# Patient Record
Sex: Female | Born: 1975
Health system: Southern US, Community
[De-identification: ages and names within clinical notes are randomized; demographics above are authoritative.]

## PROBLEM LIST (undated history)

## (undated) DIAGNOSIS — F319 Bipolar disorder, unspecified: Secondary | ICD-10-CM

## (undated) DIAGNOSIS — Z789 Other specified health status: Secondary | ICD-10-CM

## (undated) DIAGNOSIS — F410 Panic disorder [episodic paroxysmal anxiety] without agoraphobia: Secondary | ICD-10-CM

## (undated) DIAGNOSIS — G43909 Migraine, unspecified, not intractable, without status migrainosus: Secondary | ICD-10-CM

## (undated) HISTORY — PX: HERNIA REPAIR: SHX51

## (undated) HISTORY — DX: Panic disorder (episodic paroxysmal anxiety): F41.0

## (undated) HISTORY — PX: OTHER SURGICAL HISTORY: SHX169

## (undated) HISTORY — DX: Migraine, unspecified, not intractable, without status migrainosus: G43.909

## (undated) HISTORY — DX: Bipolar disorder, unspecified: F31.9

---

## 2004-10-25 ENCOUNTER — Ambulatory Visit: Payer: Self-pay | Admitting: Psychiatry

## 2004-10-25 ENCOUNTER — Inpatient Hospital Stay (HOSPITAL_COMMUNITY): Admission: RE | Admit: 2004-10-25 | Discharge: 2004-10-28 | Payer: Self-pay | Admitting: Psychiatry

## 2018-01-29 ENCOUNTER — Inpatient Hospital Stay (HOSPITAL_COMMUNITY)
Admission: RE | Admit: 2018-01-29 | Discharge: 2018-02-01 | DRG: 885 | Disposition: A | Discharge status: Home / Self Care | Payer: Federal, State, Local not specified - Other | Attending: Psychiatry | Admitting: Psychiatry

## 2018-01-29 DIAGNOSIS — F332 Major depressive disorder, recurrent severe without psychotic features: Secondary | ICD-10-CM | POA: Diagnosis not present

## 2018-01-29 DIAGNOSIS — R45851 Suicidal ideations: Secondary | ICD-10-CM | POA: Diagnosis present

## 2018-01-29 DIAGNOSIS — F419 Anxiety disorder, unspecified: Secondary | ICD-10-CM | POA: Diagnosis present

## 2018-01-29 DIAGNOSIS — Z79899 Other long term (current) drug therapy: Secondary | ICD-10-CM | POA: Diagnosis not present

## 2018-01-29 DIAGNOSIS — R51 Headache: Secondary | ICD-10-CM | POA: Diagnosis present

## 2018-01-29 DIAGNOSIS — G4700 Insomnia, unspecified: Secondary | ICD-10-CM | POA: Diagnosis present

## 2018-01-29 HISTORY — DX: Other specified health status: Z78.9

## 2018-01-29 NOTE — H&P (Signed)
Behavioral Health Medical Screening Exam  Alicia Carlson is an 42 y.o. female.presenting to [facility] with her husband. She is endorsing moderate to severe MDD recurrent with SI/plan. She has a previous hx of IP admission 2006. She is compliant with her medications. She is denying nay AVH. She has a hx of migraines.  Total Time spent with patient: 20 minutes  Psychiatric Specialty Exam: Physical Exam  Constitutional: She is oriented to person, place, and time. She appears well-developed and well-nourished. No distress.  HENT:  Head: Normocephalic.  Eyes: Pupils are equal, round, and reactive to light.  Respiratory: Effort normal and breath sounds normal. No respiratory distress.  Neurological: She is alert and oriented to person, place, and time. No cranial nerve deficit.  Skin: Skin is warm and dry. She is not diaphoretic.  Psychiatric: Her speech is normal. Judgment normal. Her mood appears anxious. She is withdrawn. Cognition and memory are normal. She exhibits a depressed mood. She expresses suicidal ideation. She expresses suicidal plans.    Review of Systems  Constitutional: Negative for chills, diaphoresis, fever, malaise/fatigue and weight loss.  Neurological: Positive for headaches.  Psychiatric/Behavioral: Positive for depression and suicidal ideas. The patient is nervous/anxious.     There were no vitals taken for this visit.There is no height or weight on file to calculate BMI.  General Appearance: Casual  Eye Contact:  Good  Speech:  Clear and Coherent  Volume:  Normal  Mood:  Depressed  Affect:  Congruent  Thought Process:  Goal Directed  Orientation:  Full (Time, Place, and Person)  Thought Content:  Logical  Suicidal Thoughts:  Yes.  with intent/plan  Homicidal Thoughts:  No  Memory:  Immediate;   Good  Judgement:  Fair  Insight:  Fair  Psychomotor Activity:  Normal  Concentration: Concentration: Good  Recall:  Fair  Fund of Knowledge:Fair  Language: Good   Akathisia:  Negative  Handed:  Right  AIMS (if indicated):     Assets:  Desire for Improvement  Sleep:       Musculoskeletal: Strength & Muscle Tone: within normal limits Gait & Station: normal Patient leans: N/A  There were no vitals taken for this visit.  Recommendations:  Based on my evaluation the patient does not appear to have an emergency medical condition.  Kerry Hough, PA-C 01/29/2018, 11:40 PM

## 2018-01-30 ENCOUNTER — Encounter (HOSPITAL_COMMUNITY): Payer: Self-pay

## 2018-01-30 ENCOUNTER — Other Ambulatory Visit: Payer: Self-pay

## 2018-01-30 DIAGNOSIS — F332 Major depressive disorder, recurrent severe without psychotic features: Principal | ICD-10-CM

## 2018-01-30 DIAGNOSIS — R45851 Suicidal ideations: Secondary | ICD-10-CM

## 2018-01-30 LAB — URINALYSIS, ROUTINE W REFLEX MICROSCOPIC
Bilirubin Urine: NEGATIVE
Glucose, UA: NEGATIVE mg/dL
Hgb urine dipstick: NEGATIVE
Ketones, ur: NEGATIVE mg/dL
Nitrite: NEGATIVE
Protein, ur: NEGATIVE mg/dL
Specific Gravity, Urine: 1.01 (ref 1.005–1.030)
pH: 6 (ref 5.0–8.0)

## 2018-01-30 LAB — COMPREHENSIVE METABOLIC PANEL
ALT: 29 U/L (ref 0–44)
AST: 23 U/L (ref 15–41)
Albumin: 4.1 g/dL (ref 3.5–5.0)
Alkaline Phosphatase: 64 U/L (ref 38–126)
Anion gap: 9 (ref 5–15)
BUN: 16 mg/dL (ref 6–20)
CO2: 24 mmol/L (ref 22–32)
Calcium: 9.1 mg/dL (ref 8.9–10.3)
Chloride: 104 mmol/L (ref 98–111)
Creatinine, Ser: 1.37 mg/dL — ABNORMAL HIGH (ref 0.44–1.00)
GFR calc Af Amer: 53 mL/min — ABNORMAL LOW (ref >60)
GFR calc non Af Amer: 46 mL/min — ABNORMAL LOW (ref >60)
Glucose, Bld: 104 mg/dL — ABNORMAL HIGH (ref 70–99)
Potassium: 3.5 mmol/L (ref 3.5–5.1)
Sodium: 137 mmol/L (ref 135–145)
Total Bilirubin: 0.5 mg/dL (ref 0.3–1.2)
Total Protein: 7.1 g/dL (ref 6.5–8.1)

## 2018-01-30 LAB — CBC
HCT: 40.9 % (ref 36.0–46.0)
Hemoglobin: 13.3 g/dL (ref 12.0–15.0)
MCH: 29.8 pg (ref 26.0–34.0)
MCHC: 32.5 g/dL (ref 30.0–36.0)
MCV: 91.5 fL (ref 80.0–100.0)
Platelets: 256 10*3/uL (ref 150–400)
RBC: 4.47 MIL/uL (ref 3.87–5.11)
RDW: 13.2 % (ref 11.5–15.5)
WBC: 10.6 10*3/uL — ABNORMAL HIGH (ref 4.0–10.5)
nRBC: 0 % (ref 0.0–0.2)

## 2018-01-30 LAB — RAPID URINE DRUG SCREEN, HOSP PERFORMED
Amphetamines: NOT DETECTED
Barbiturates: NOT DETECTED
Benzodiazepines: NOT DETECTED
Cocaine: NOT DETECTED
Opiates: NOT DETECTED
Tetrahydrocannabinol: NOT DETECTED

## 2018-01-30 LAB — LIPID PANEL
Cholesterol: 291 mg/dL — ABNORMAL HIGH (ref 0–200)
HDL: 52 mg/dL (ref >40)
LDL Cholesterol: 201 mg/dL — ABNORMAL HIGH (ref 0–99)
Total CHOL/HDL Ratio: 5.6 RATIO
Triglycerides: 191 mg/dL — ABNORMAL HIGH (ref <150)
VLDL: 38 mg/dL (ref 0–40)

## 2018-01-30 LAB — TSH: TSH: 4.468 u[IU]/mL (ref 0.350–4.500)

## 2018-01-30 LAB — PREGNANCY, URINE: Preg Test, Ur: NEGATIVE

## 2018-01-30 MED ORDER — LAMOTRIGINE 100 MG PO TABS
100.0000 mg | ORAL_TABLET | Freq: Two times a day (BID) | ORAL | Status: DC
  Administered 2018-01-30 – 2018-02-01 (×5): 100 mg via ORAL
  Filled 2018-01-30 (×9): qty 1

## 2018-01-30 MED ORDER — TRAZODONE HCL 100 MG PO TABS
ORAL_TABLET | ORAL | Status: AC
  Filled 2018-01-30: qty 1

## 2018-01-30 MED ORDER — DULOXETINE HCL 60 MG PO CPEP
60.0000 mg | ORAL_CAPSULE | Freq: Two times a day (BID) | ORAL | Status: DC
  Filled 2018-01-30 (×3): qty 1

## 2018-01-30 MED ORDER — ACETAMINOPHEN 325 MG PO TABS: 650.0000 mg | ORAL_TABLET | Freq: Four times a day (QID) | ORAL | Status: DC | PRN

## 2018-01-30 MED ORDER — TOPIRAMATE 25 MG PO TABS
50.0000 mg | ORAL_TABLET | Freq: Two times a day (BID) | ORAL | Status: DC
  Administered 2018-01-30 – 2018-02-01 (×4): 50 mg via ORAL
  Filled 2018-01-30 (×8): qty 2

## 2018-01-30 MED ORDER — ALUM & MAG HYDROXIDE-SIMETH 200-200-20 MG/5ML PO SUSP: 30.0000 mL | ORAL | Status: DC | PRN

## 2018-01-30 MED ORDER — HYDROXYZINE HCL 25 MG PO TABS
25.0000 mg | ORAL_TABLET | Freq: Four times a day (QID) | ORAL | Status: DC | PRN
  Administered 2018-01-30 (×2): 25 mg via ORAL
  Filled 2018-01-30: qty 1

## 2018-01-30 MED ORDER — LAMOTRIGINE 200 MG PO TABS
200.0000 mg | ORAL_TABLET | Freq: Every day | ORAL | Status: DC
  Filled 2018-01-30 (×2): qty 1

## 2018-01-30 MED ORDER — DULOXETINE HCL 30 MG PO CPEP
90.0000 mg | ORAL_CAPSULE | Freq: Every day | ORAL | Status: DC
  Administered 2018-01-30 – 2018-02-01 (×3): 90 mg via ORAL
  Filled 2018-01-30 (×6): qty 3

## 2018-01-30 MED ORDER — TOPIRAMATE 25 MG PO TABS
50.0000 mg | ORAL_TABLET | Freq: Every day | ORAL | Status: DC
  Administered 2018-01-30: 50 mg via ORAL
  Filled 2018-01-30 (×3): qty 2

## 2018-01-30 MED ORDER — MIRTAZAPINE 15 MG PO TABS
15.0000 mg | ORAL_TABLET | Freq: Every day | ORAL | Status: DC
  Administered 2018-01-30 – 2018-01-31 (×2): 15 mg via ORAL
  Filled 2018-01-30 (×4): qty 1

## 2018-01-30 MED ORDER — TRAZODONE HCL 100 MG PO TABS: 100.0000 mg | ORAL_TABLET | Freq: Every evening | ORAL | Status: DC | PRN

## 2018-01-30 MED ORDER — ADULT MULTIVITAMIN W/MINERALS CH
1.0000 | ORAL_TABLET | Freq: Every day | ORAL | Status: DC
  Administered 2018-01-30 – 2018-02-01 (×3): 1 via ORAL
  Filled 2018-01-30 (×5): qty 1

## 2018-01-30 MED ORDER — MAGNESIUM HYDROXIDE 400 MG/5ML PO SUSP: 30.0000 mL | Freq: Every day | ORAL | Status: DC | PRN

## 2018-01-30 MED ORDER — TRAZODONE HCL 50 MG PO TABS
50.0000 mg | ORAL_TABLET | Freq: Every evening | ORAL | Status: DC | PRN
  Administered 2018-01-30 – 2018-01-31 (×2): 50 mg via ORAL
  Filled 2018-01-30 (×8): qty 1

## 2018-01-30 MED ORDER — TRAZODONE HCL 100 MG PO TABS
100.0000 mg | ORAL_TABLET | Freq: Every evening | ORAL | Status: DC | PRN
  Administered 2018-01-30: 100 mg via ORAL
  Filled 2018-01-30 (×3): qty 1

## 2018-01-30 MED ORDER — LAMOTRIGINE 200 MG PO TABS: 200.0000 mg | ORAL_TABLET | Freq: Every day | ORAL | Status: DC

## 2018-01-30 NOTE — BHH Counselor (Signed)
Adult Comprehensive Assessment  Patient ID: Alicia Carlson, female   DOB: 09-26-1975, 42 y.o.   MRN: [id number]  Information Source: Information source: Patient  Current Stressors:  Patient states their primary concerns and needs for treatment are:: "I could not stop thinking about killing myself" Patient states their goals for this hospitilization and ongoing recovery are:: "Get rid of the suicidal thoughts" Educational / Learning stressors: Patient denies any stressors  Employment / Job issues: On disability; Unemployed --Recently quit her  part-time job  Family Relationships: Patient reports having a severly strained relationship with her daughter; Patient reports this stressor has triggered her depressive symptoms, including suicidal thoughts. Patient also reports she and her husband have some marital issues due to their mental health issues.  Financial / Lack of resources (include bankruptcy): SSDI; Patient denies any current stressors  Housing / Lack of housing: Live with husband in [place], [place] Physical health (include injuries & life threatening diseases): Patient reports experiencing migraines occassionally.  Social relationships: Patient reports having a distant relationship with her friends currently.  Substance abuse: Patient denies any substance abuse issues  Bereavement / Loss: Patient denies any stressors   Living/Environment/Situation:  Living Arrangements: Spouse/significant other Living conditions (as described by patient or guardian): "Good" Who else lives in the home?: Husband  How long has patient lived in current situation?: 12 years  What is atmosphere in current home: Comfortable, Loving  Family History:  Marital status: Married Number of Years Married: 17 What types of issues is patient dealing with in the relationship?: Patient reports her husband struggles with depression and that his mother is priority over her.  Additional relationship information: No Are you  sexually active?: No What is your sexual orientation?: Heterosexual  Has your sexual activity been affected by drugs, alcohol, medication, or emotional stress?: No  Does patient have children?: Yes How many children?: 1 How is patient's relationship with their children?: Patient reports that she currently has a strained relationship with her only adult daughter.   Childhood History:  By whom was/is the patient raised?: Both parents Description of patient's relationship with caregiver when they were a child: Patient reports having an "okay" relationship with her parents as a child.  Patient's description of current relationship with people who raised him/her: Patient states that her mother is currently deceased, however she continues to have an"okay" relationship with her father.  How were you disciplined when you got in trouble as a child/adolescent?: Whoopings  Does patient have siblings?: Yes Number of Siblings: 1 Description of patient's current relationship with siblings: Patient reports having an "okay" relationship with her younger brother currently. Patient reports they have been closer in the past. Patient reports not having a relationship with her older sister.  Did patient suffer any verbal/emotional/physical/sexual abuse as a child?: No Did patient suffer from severe childhood neglect?: No Has patient ever been sexually abused/assaulted/raped as an adolescent or adult?: No Was the patient ever a victim of a crime or a disaster?: No Witnessed domestic violence?: No Has patient been effected by domestic violence as an adult?: No  Education:  Highest grade of school patient has completed: 10th grade  Currently a student?: No Learning disability?: No  Employment/Work Situation:   Employment situation: Unemployed Patient's job has been impacted by current illness: Yes Describe how patient's job has been impacted: Patient reports she recently quit her job due  What is the longest  time patient has a held a job?: 2 1/2 years  Where was the patient  employed at that time?: [employer]  Did You Receive Any Psychiatric Treatment/Services While in the [employer]?: No Are There Guns or Other Weapons in Your Home?: Yes Types of Guns/Weapons: Guns Are These Comptroller?: Yes  Financial Resources:   Financial resources: Insurance claims handler, Income from spouse Does patient have a Lawyer or guardian?: No  Alcohol/Substance Abuse:   What has been your use of drugs/alcohol within the last 12 months?: Patient denies  If attempted suicide, did drugs/alcohol play a role in this?: No Alcohol/Substance Abuse Treatment Hx: Denies past history Has alcohol/substance abuse ever caused legal problems?: No  Social Support System:   Forensic psychologist System: Poor Type of faith/religion: Spiritual  How does patient's faith help to cope with current illness?: Prayer   Leisure/Recreation:   Leisure and Hobbies: "I love to shop, I like going to concerts I enjoy going to the movies, I like walking and I'm starting up pottery classes"  Strengths/Needs:   What is the patient's perception of their strengths?: "Insightful, and I have a good sense of humor" Patient states they can use these personal strengths during their treatment to contribute to their recovery: Yes  Patient states these barriers may affect/interfere with their treatment: No  Patient states these barriers may affect their return to the community: No  Other important information patient would like considered in planning for their treatment: No   Discharge Plan:   Currently receiving community mental health services: Yes (From Whom)( Counseling and [facility] ) Patient states concerns and preferences for aftercare planning are: Continue with current providers.  Patient states they will know when they are safe and ready for discharge when: Decrease in symptoms.  Does patient have access to  transportation?: Yes Does patient have financial barriers related to discharge medications?: Yes Patient description of barriers related to discharge medications: No insurance  Will patient be returning to same living situation after discharge?: Yes  Summary/Recommendations:   Summary and Recommendations (to be completed by the evaluator): Alicia Carlson is a 42 year old female who is diagnosed with MDD recurrent severe. She presented to the hospital seeking treatment for worsening depressive symtpoms and suicidal ideation. During the assessment, Alicia Carlson was pleasant and cooperative, however seemed to be slightly tangential. Alicia Carlson reports that she came to the hospital because she "could not stop thinking about killing herself". Alicia Carlson reports having a strained relationship with her daughter and husband, which she beleives has triggered this most recent episode. Alicia Carlson states that she currently sees Alicia Carlson teacher at [employer] and goes to [employer] for medication management. Alicia Carlson can benefit from crisis stabilization, medication management, therapeutic milieu and referral services.   Maeola Sarah. 01/30/2018
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
## 2018-01-30 NOTE — Progress Notes (Signed)
Admission Note:  Alicia Carlson is a 42 y.o. female admitted as a walk-in with SI with plan to slit wrist. Pt was last here in 2006. Pt denies HI/AVH/Pain. Pt denies drug/alcohol/tobacco-use. Pt states primary stressor is family dynamics. Pt states "My daughter blames me but I pay for everything". Pt also endorses difficulty getting out of bed and not taking care of self with hygiene. Pt states she put in a notice 10/21 at work. 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 Pt states she lives with her husband. Pt states she is on disability for mental illness. Pt state she sees TEFL teacher at [employer] and attends [employer]. Pt states she takes Lamictal, Cymbalta, Topamax, and Vistaril. Pt states she is compliant with her medications. Skin was assessed and found to be clear of any abnormal marks. Pt searched and no contraband found, POC and unit policies explained and understanding verbalized. Consents obtained. Food and fluids offered, and fluids accepted. Pt had no additional questions or concerns. Belongings in locker #10.

## 2018-01-30 NOTE — BHH Group Notes (Signed)
LCSW Group Therapy Note 01/30/2018 12:12 PM  Type of Therapy/Topic: Group Therapy: Feelings about Diagnosis  Participation Level: Active   Description of Group:  This group will allow patients to explore their thoughts and feelings about diagnoses they have received. Patients will be guided to explore their level of understanding and acceptance of these diagnoses. Facilitator will encourage patients to process their thoughts and feelings about the reactions of others to their diagnosis and will guide patients in identifying ways to discuss their diagnosis with significant others in their lives. This group will be process-oriented, with patients participating in exploration of their own experiences, giving and receiving support, and processing challenge from other group members.  Therapeutic Goals: 1. Patient will demonstrate understanding of diagnosis as evidenced by identifying two or more symptoms of the disorder 2. Patient will be able to express two feelings regarding the diagnosis 3. Patient will demonstrate their ability to communicate their needs through discussion and/or role play  Summary of Patient Progress:  Alicia Carlson was engaged and participated throughout the group session. Alicia Carlson states that she accepted her mental health diagnosis initially because she knew something was wrong for " a while".      Therapeutic Modalities:  Cognitive Behavioral Therapy Brief Therapy Feelings Identification    Earvin Blazier Catalina Antigua Clinical Social Worker

## 2018-01-30 NOTE — Progress Notes (Signed)
Urine cup provided. Pending sample.

## 2018-01-30 NOTE — BHH Counselor (Signed)
CSW attempted to see the patient to complete the assessment, however the patient reports she does not feel well and did not want to participate in the assessment process. CSW will attempt to see the patient at a later time.   Baldo Daub, MSW, LCSWA Clinical Social Worker [facility]  Phone: [phone]

## 2018-01-30 NOTE — BHH Suicide Risk Assessment (Signed)
[facility] Admission Suicide Risk Assessment   Nursing information obtained from:    Demographic factors:  Caucasian Current Mental Status:  Suicidal ideation indicated by patient, Suicide plan Loss Factors:  Financial problems / change in socioeconomic status Historical Factors:  Prior suicide attempts Risk Reduction Factors:  Living with another person, especially a relative, Positive social support  Total Time spent with patient: 20 minutes Principal Problem: <principal problem not specified> Diagnosis:   Patient Active Problem List   Diagnosis Date Noted  . MDD (major depressive disorder), recurrent episode, severe (HCC) [F33.2] 01/30/2018   Subjective Data: Patient is seen and examined.  Patient is a 42 year old female with a past psychiatric history reported to be bipolar disorder type II who presented as a walk-in to the behavioral health hospital on 01/30/2018.  Patient stated that she been having worsening depressive illness over the last several weeks.  She stated that she had been thinking about killing herself.  She told her husband on the date of admission that she had a plan to "slit my wrists".  The major stressor in her life is her daughter.  The patient stated that her daughter moved out a year ago, and had significant mental health problems.  The patient's been sending the daughter money for the last year.  She stopped giving money to the daughter proximally a month ago, and the daughter is been more enraged towards her.  She actually stated that she thought her mother (the patient) was "stalking her".  Patient stated that she is been depressed since the day after she gave birth to her daughter.  She is followed at the local mental health center in [place], and sees a therapist.  She was unsure of whether or not they worked on [employer], [employer].  She has been on multiple medications in the past.  She thought the Cymbalta that she is currently taking was the most effective.  She was  admitted to the hospital for evaluation and stabilization.  Continued Clinical Symptoms:  Alcohol Use Disorder Identification Test Final Score (AUDIT): 0 The "Alcohol Use Disorders Identification Test", Guidelines for Use in Primary Care, Second Edition.  World Science writer [facility]). Score between 0-7:  no or low risk or alcohol related problems. Score between 8-15:  moderate risk of alcohol related problems. Score between 16-19:  high risk of alcohol related problems. Score 20 or above:  warrants further diagnostic evaluation for alcohol dependence and treatment.   CLINICAL FACTORS:   Bipolar Disorder:   Bipolar II Depression:   Anhedonia Hopelessness Impulsivity Insomnia Personality Disorders:   Cluster B   Musculoskeletal: Strength & Muscle Tone: within normal limits Gait & Station: normal Patient leans: N/A  Psychiatric Specialty Exam: Physical Exam  Nursing note and vitals reviewed. Constitutional: She is oriented to person, place, and time. She appears well-developed and well-nourished.  HENT:  Head: Normocephalic and atraumatic.  Respiratory: Effort normal.  Neurological: She is alert and oriented to person, place, and time.    ROS  Blood pressure 93/74, pulse 93, temperature 98.3 F (36.8 C), temperature source Oral, resp. rate 20, height 5\' 2"  (1.575 m), weight 74.4 kg.Body mass index is 30 kg/m.  General Appearance: Casual  Eye Contact:  Minimal  Speech:  Normal Rate  Volume:  Normal  Mood:  Anxious  Affect:  Congruent  Thought Process:  Coherent and Descriptions of Associations: Intact  Orientation:  Full (Time, Place, and Person)  Thought Content:  Logical  Suicidal Thoughts:  Yes.  without  intent/plan  Homicidal Thoughts:  No  Memory:  Immediate;   Fair Recent;   Fair Remote;   Fair  Judgement:  Impaired  Insight:  Lacking  Psychomotor Activity:  Normal  Concentration:  Concentration: Fair and Attention Span: Fair  Recall:  [employer] of  Knowledge:  Fair  Language:  Fair  Akathisia:  Negative  Handed:  Right  AIMS (if indicated):     Assets:  Communication Skills Desire for Improvement Financial Resources/Insurance Housing Physical Health Resilience Social Support  ADL's:  Intact  Cognition:  WNL  Sleep:  Number of Hours: 4      COGNITIVE FEATURES THAT CONTRIBUTE TO RISK:  None    SUICIDE RISK:   Minimal: No identifiable suicidal ideation.  Patients presenting with no risk factors but with morbid ruminations; may be classified as minimal risk based on the severity of the depressive symptoms  PLAN OF CARE: Patient is seen and examined.  Patient is a 42 year old female with the above-stated past psychiatric history was admitted secondary to suicidal ideation worsening depression.  She will be integrated into the milieu.  She will be encouraged to attend groups.  She will be seen by social work.  She is encouraged to work on her coping skills.  Her Cymbalta being increased to 90 mg p.o. daily.  We will find out what the dosage of her mirtazapine is.  Her Lamictal be continued at 100 mg p.o. twice daily.  We will get collateral information from her husband.  I certify that inpatient services furnished can reasonably be expected to improve the patient's condition.   Antonieta Pert, MD 01/30/2018, 8:03 AM

## 2018-01-30 NOTE — Tx Team (Signed)
Initial Treatment Plan 01/30/2018 1:01 AM Roselee Nova ZOX:[id number]    PATIENT STRESSORS: Financial difficulties Marital or family conflict   PATIENT STRENGTHS: Average or above average intelligence Communication skills General fund of knowledge Physical Health Supportive family/friends Work skills   PATIENT IDENTIFIED PROBLEMS: "depression"   "At risk for suicide"   "Family problems"                  DISCHARGE CRITERIA:  Ability to meet basic life and health needs Improved stabilization in mood, thinking, and/or behavior Verbal commitment to aftercare and medication compliance  PRELIMINARY DISCHARGE PLAN: Attend PHP/IOP Outpatient therapy Participate in family therapy Return to previous living arrangement  PATIENT/FAMILY INVOLVEMENT: This treatment plan has been presented to and reviewed with the patient, [employer].The patient have been given the opportunity to ask questions and make suggestions.  Tyrone Apple, RN 01/30/2018, 1:01 AM

## 2018-01-30 NOTE — H&P (Signed)
Psychiatric Admission Assessment Adult  Patient Identification: Alicia Carlson MRN:  [id number] Date of Evaluation:  01/30/2018 Chief Complaint:  MDD Principal Diagnosis: <principal problem not specified> Diagnosis:   Patient Active Problem List   Diagnosis Date Noted  . MDD (major depressive disorder), recurrent episode, severe ([place]) [F33.2] 01/30/2018   History of Present Illness: Patient is seen and examined.  Patient is a 42 year old female with a past psychiatric history reported to be bipolar disorder type II who presented as a walk-in to the behavioral health hospital on 01/30/2018.  Patient stated that she been having worsening depressive illness over the last several weeks.  She stated that she had been thinking about killing herself.  She told her husband on the date of admission that she had a plan to "slit my wrists".  The major stressor in her life is her daughter.  The patient stated that her daughter moved out a year ago, and had significant mental health problems.  The patient's been sending the daughter money for the last year.  She stopped giving money to the daughter proximally a month ago, and the daughter is been more enraged towards her.  She actually stated that she thought her mother (the patient) was "stalking her".  Patient stated that she is been depressed since the day after she gave birth to her daughter.  She is followed at the local mental health center in [place], and sees a therapist.  She was unsure of whether or not they worked on Audiological scientist.  She has been on multiple medications in the past.  She thought the Cymbalta that she is currently taking was the most effective.  She was admitted to the hospital for evaluation and stabilization.  Associated Signs/Symptoms: Depression Symptoms:  depressed mood, anhedonia, insomnia, psychomotor agitation, fatigue, feelings of worthlessness/guilt, difficulty concentrating, hopelessness, suicidal thoughts  without plan, anxiety, loss of energy/fatigue, disturbed sleep, (Hypo) Manic Symptoms:  Impulsivity, Irritable Mood, Anxiety Symptoms:  Excessive Worry, Psychotic Symptoms:  Denied PTSD Symptoms: Negative Total Time spent with patient: 30 minutes  Past Psychiatric History: Patient had one previous psychiatric admission here several years ago.  She is been followed at the mental health center in [place] for several years.  By her report "I been treated with every medicine there is".  She feels like the Cymbalta is a medicine that has helped her the most.  Is the patient at risk to self? Yes.    Has the patient been a risk to self in the past 6 months? Yes.    Has the patient been a risk to self within the distant past? Yes.    Is the patient a risk to others? No.  Has the patient been a risk to others in the past 6 months? No.  Has the patient been a risk to others within the distant past? No.   Prior Inpatient Therapy: Prior Inpatient Therapy: Yes Prior Therapy Dates: 2006 Prior Therapy Facilty/Provider(s): [facility] Reason for Treatment: SI Prior Outpatient Therapy: Prior Outpatient Therapy: Yes Prior Therapy Dates: Current Prior Therapy Facilty/Provider(s): [facility] [place] Reason for Treatment: therapist / med management Does patient have an ACCT team?: No Does patient have Intensive In-House Services?  : No Does patient have [facility] services? : No Does patient have P4CC services?: No  Alcohol Screening: 1. How often do you have a drink containing alcohol?: Never 2. How many drinks containing alcohol do you have on a typical day when you are drinking?: 1 or 2  3. How often do you have six or more drinks on one occasion?: Never AUDIT-C Score: 0 4. How often during the last year have you found that you were not able to stop drinking once you had started?: Never 5. How often during the last year have you failed to do what was normally expected from you  becasue of drinking?: Never 6. How often during the last year have you needed a first drink in the morning to get yourself going after a heavy drinking session?: Never 7. How often during the last year have you had a feeling of guilt of remorse after drinking?: Never 8. How often during the last year have you been unable to remember what happened the night before because you had been drinking?: Never 9. Have you or someone else been injured as a result of your drinking?: No 10. Has a relative or friend or a doctor or another health worker been concerned about your drinking or suggested you cut down?: No Alcohol Use Disorder Identification Test Final Score (AUDIT): 0 Substance Abuse History in the last 12 months:  No. Consequences of Substance Abuse: Negative Previous Psychotropic Medications: Yes  Psychological Evaluations: Yes  Past Medical History:  Past Medical History:  Diagnosis Date  . Medical history non-contributory    History reviewed. No pertinent surgical history. Family History: History reviewed. No pertinent family history. Family Psychiatric  History: Daughter has borderline personality disorder per the patient's description.  The patient also stated that the mother side of her family has multiple members that are psychiatrically ill. Tobacco Screening: Have you used any form of tobacco in the last 30 days? (Cigarettes, Smokeless Tobacco, Cigars, and/or Pipes): No Social History:  Social History   Substance and Sexual Activity  Alcohol Use Not Currently     Social History   Substance and Sexual Activity  Drug Use Never    Additional Social History: Marital status: Married    Pain Medications: None Prescriptions: Cymbalta 64m, Prozac 252m Lamictal 10078mwice daily, Vistaril 9m68mopomax 50mg105mr the Counter: None History of alcohol / drug use?: No history of alcohol / drug abuse                    Allergies:  No Known Allergies Lab Results:  Results  for orders placed or performed during the hospital encounter of 01/29/18 (from the past 48 hour(s))  Urinalysis, Routine w reflex microscopic     Status: Abnormal   Collection Time: 01/30/18  6:28 AM  Result Value Ref Range   Color, Urine YELLOW YELLOW   APPearance CLEAR CLEAR   Specific Gravity, Urine 1.010 1.005 - 1.030   pH 6.0 5.0 - 8.0   Glucose, UA NEGATIVE NEGATIVE mg/dL   Hgb urine dipstick NEGATIVE NEGATIVE   Bilirubin Urine NEGATIVE NEGATIVE   Ketones, ur NEGATIVE NEGATIVE mg/dL   Protein, ur NEGATIVE NEGATIVE mg/dL   Nitrite NEGATIVE NEGATIVE   Leukocytes, UA TRACE (A) NEGATIVE   RBC / HPF 0-5 0 - 5 RBC/hpf   WBC, UA 0-5 0 - 5 WBC/hpf   Bacteria, UA RARE (A) NONE SEEN   Squamous Epithelial / LPF 0-5 0 - 5   Mucus PRESENT     Comment: Performed at [facility] [id number], urine     Status: None   Collection Time: 01/30/18  6:28 AM  Result Value Ref Range   Preg Test, Ur NEGATIVE NEGATIVE  Comment:        THE SENSITIVITY OF THIS METHODOLOGY IS >20 mIU/mL. Performed at [facility], [place] [address] [place], [place] [address]   Urine rapid drug screen (hosp performed)not at [facility], [facility]     Status: None   Collection Time: 01/30/18  6:28 AM  Result Value Ref Range   Opiates NONE DETECTED NONE DETECTED   Cocaine NONE DETECTED NONE DETECTED   Benzodiazepines NONE DETECTED NONE DETECTED   Amphetamines NONE DETECTED NONE DETECTED   Tetrahydrocannabinol NONE DETECTED NONE DETECTED   Barbiturates NONE DETECTED NONE DETECTED    Comment: (NOTE) DRUG SCREEN FOR MEDICAL PURPOSES ONLY.  IF CONFIRMATION IS NEEDED FOR ANY PURPOSE, NOTIFY LAB WITHIN 5 DAYS. LOWEST DETECTABLE LIMITS FOR URINE DRUG SCREEN Drug Class                     Cutoff (ng/mL) Amphetamine and metabolites    1000 Barbiturate and metabolites    200 Benzodiazepine                 962 Tricyclics and metabolites     300 Opiates and  metabolites        300 Cocaine and metabolites        300 THC                            50 Performed at [facility], [place] [address], [place], [place] [address]   CBC     Status: Abnormal   Collection Time: 01/30/18  6:32 AM  Result Value Ref Range   WBC 10.6 (H) 4.0 - 10.5 K/uL   RBC 4.47 3.87 - 5.11 MIL/uL   Hemoglobin 13.3 12.0 - 15.0 g/dL   HCT 40.9 36.0 - 46.0 %   MCV 91.5 80.0 - 100.0 fL   MCH 29.8 26.0 - 34.0 pg   MCHC 32.5 30.0 - 36.0 g/dL   RDW 13.2 11.5 - 15.5 %   Platelets 256 150 - 400 K/uL   nRBC 0.0 0.0 - 0.2 %    Comment: Performed at [facility], [place] [address], [place], [place] [address]  Comprehensive metabolic panel     Status: Abnormal   Collection Time: 01/30/18  6:32 AM  Result Value Ref Range   Sodium 137 135 - 145 mmol/L   Potassium 3.5 3.5 - 5.1 mmol/L   Chloride 104 98 - 111 mmol/L   CO2 24 22 - 32 mmol/L   Glucose, Bld 104 (H) 70 - 99 mg/dL   BUN 16 6 - 20 mg/dL   Creatinine, Ser 1.37 (H) 0.44 - 1.00 mg/dL   Calcium 9.1 8.9 - 10.3 mg/dL   Total Protein 7.1 6.5 - 8.1 g/dL   Albumin 4.1 3.5 - 5.0 g/dL   AST 23 15 - 41 U/L   ALT 29 0 - 44 U/L   Alkaline Phosphatase 64 38 - 126 U/L   Total Bilirubin 0.5 0.3 - 1.2 mg/dL   GFR calc non Af Amer 46 (L) >60 mL/min   GFR calc Af Amer 53 (L) >60 mL/min    Comment: (NOTE) The eGFR has been calculated using the CKD EPI equation. This calculation has not been validated in all clinical situations. eGFR's persistently <60 mL/min signify possible Chronic Kidney Disease.    Anion gap 9 5 - 15    Comment: Performed at [facility], [address]  [place], [place] [address]  Lipid panel     Status: Abnormal   Collection Time: 01/30/18  6:32 AM  Result Value Ref Range   Cholesterol 291 (H) 0 - 200 mg/dL   Triglycerides 191 (H) <150 mg/dL   HDL 52 >40 mg/dL   Total CHOL/HDL Ratio 5.6 RATIO   VLDL 38 0 - 40 mg/dL   LDL Cholesterol 201 (H) 0 - 99  mg/dL    Comment:        Total Cholesterol/HDL:CHD Risk Coronary Heart Disease Risk Table                     Men   Women  1/2 Average Risk   3.4   3.3  Average Risk       5.0   4.4  2 X Average Risk   9.6   7.1  3 X Average Risk  23.4   11.0        Use the calculated Patient Ratio above and the CHD Risk Table to determine the patient's CHD Risk.        ATP III CLASSIFICATION (LDL):  <100     mg/dL   Optimal  100-129  mg/dL   Near or Above                    Optimal  130-159  mg/dL   Borderline  160-189  mg/dL   High  >190     mg/dL   Very High Performed at [place] [address], [place], [place] [address]   TSH     Status: None   Collection Time: 01/30/18  6:32 AM  Result Value Ref Range   TSH 4.468 0.350 - 4.500 uIU/mL    Comment: Performed by a 3rd Generation assay with a functional sensitivity of <=0.01 uIU/mL. Performed at [facility], [place] [address], [place], [place] [address]     Blood Alcohol level:  No results found for: [facility]  Metabolic Disorder Labs:  No results found for: HGBA1C, MPG No results found for: PROLACTIN Lab Results  Component Value Date   CHOL 291 (H) 01/30/2018   TRIG 191 (H) 01/30/2018   HDL 52 01/30/2018   CHOLHDL 5.6 01/30/2018   VLDL 38 01/30/2018   LDLCALC 201 (H) 01/30/2018    Current Medications: Current Facility-Administered Medications  Medication Dose Route Frequency Provider Last Rate Last Dose  . acetaminophen (TYLENOL) tablet 650 mg  650 mg Oral Q6H PRN Laverle Hobby, PA-C      . alum & mag hydroxide-simeth (MAALOX/MYLANTA) 200-200-20 MG/5ML suspension 30 mL  30 mL Oral Q4H PRN Laverle Hobby, PA-C      . DULoxetine (CYMBALTA) DR capsule 90 mg  90 mg Oral Daily Sharma Covert, MD   90 mg at 01/30/18 2130  . hydrOXYzine (ATARAX/VISTARIL) tablet 25 mg  25 mg Oral Q6H PRN Laverle Hobby, PA-C   25 mg at 01/30/18 0043  . lamoTRIgine (LAMICTAL) tablet 100 mg  100 mg Oral BID  Sharma Covert, MD   100 mg at 01/30/18 8657  . magnesium hydroxide (MILK OF MAGNESIA) suspension 30 mL  30 mL Oral Daily PRN Patriciaann Clan E, PA-C      . mirtazapine (REMERON) tablet 15 mg  15 mg Oral QHS Sharma Covert, MD      . multivitamin with minerals tablet 1 tablet  1 tablet Oral Daily Mallie Darting, Cordie Grice, MD  1 tablet at 01/30/18 1300  . topiramate (TOPAMAX) tablet 50 mg  50 mg Oral Daily Laverle Hobby, PA-C   50 mg at 01/30/18 9371  . traZODone (DESYREL) 100 MG tablet           . traZODone (DESYREL) tablet 50 mg  50 mg Oral QHS,MR X 1 Boni Maclellan, Cordie Grice, MD       PTA Medications: Medications Prior to Admission  Medication Sig Dispense Refill Last Dose  . DULoxetine (CYMBALTA) 60 MG capsule Take 60 mg by mouth daily.   01/30/2018 at Unknown time  . FLUoxetine (PROZAC) 20 MG capsule Take 20 mg by mouth daily.   01/30/2018 at Unknown time  . hydrOXYzine (ATARAX/VISTARIL) 25 MG tablet Take 25 mg by mouth every 8 (eight) hours as needed for anxiety.   01/29/2018 at Unknown time  . lamoTRIgine (LAMICTAL) 100 MG tablet Take 100 mg by mouth 2 (two) times daily.   01/30/2018 at Unknown time  . Multiple Vitamins-Minerals (ADULT ONE DAILY GUMMIES) CHEW Chew 2 tablets by mouth daily.   01/29/2018 at Unknown time  . topiramate (TOPAMAX) 25 MG tablet Take 50 mg by mouth 2 (two) times daily.   01/30/2018 at Unknown time    Musculoskeletal: Strength & Muscle Tone: within normal limits Gait & Station: normal Patient leans: N/A  Psychiatric Specialty Exam: Physical Exam  Nursing note and vitals reviewed. Constitutional: She is oriented to person, place, and time. She appears well-developed and well-nourished.  HENT:  Head: Normocephalic and atraumatic.  Respiratory: Effort normal.  Neurological: She is alert and oriented to person, place, and time.    ROS  Blood pressure 93/74, pulse 93, temperature 98.3 F (36.8 C), temperature source Oral, resp. rate 20, height _0  (1.575  m), weight 74.4 kg.Body mass index is 30 kg/m.  General Appearance: Disheveled  Eye Contact:  Fair  Speech:  Normal Rate  Volume:  Normal  Mood:  Anxious and Depressed  Affect:  Congruent  Thought Process:  Coherent and Descriptions of Associations: Intact  Orientation:  Full (Time, Place, and Person)  Thought Content:  Logical  Suicidal Thoughts:  Yes.  without intent/plan  Homicidal Thoughts:  No  Memory:  Immediate;   Fair Recent;   Fair Remote;   Fair  Judgement:  Intact  Insight:  Fair  Psychomotor Activity:  Increased  Concentration:  Concentration: Fair and Attention Span: Fair  Recall:  [employer] of Knowledge:  Fair  Language:  Fair  Akathisia:  Negative  Handed:  Right  AIMS (if indicated):     Assets:  Communication Skills Desire for Improvement Financial Resources/Insurance Housing Intimacy Physical Health Resilience Social Support  ADL's:  Intact  Cognition:  WNL  Sleep:  Number of Hours: 4    Treatment Plan Summary: Daily contact with patient to assess and evaluate symptoms and progress in treatment, Medication management and Plan : Patient is seen and examined.  Patient is a 42 year old female with the above-stated past psychiatric history was admitted secondary to suicidal ideation worsening depression.  She will be integrated into the milieu.  She will be encouraged to attend groups.  She will be seen by social work.  She is encouraged to work on her coping skills.  Her Cymbalta being increased to 90 mg p.o. daily.  We will find out what the dosage of her mirtazapine is.  Her Lamictal be continued at 100 mg p.o. twice daily.  We will get collateral information from her husband.  Observation Level/Precautions:  15 minute checks  Laboratory:  Chemistry Profile  Psychotherapy:    Medications:    Consultations:    Discharge Concerns:    Estimated LOS:  Other:     Physician Treatment Plan for Primary Diagnosis: <principal problem not specified> Long  Term Goal(s): Improvement in symptoms so as ready for discharge  Short Term Goals: Ability to identify changes in lifestyle to reduce recurrence of condition will improve, Ability to verbalize feelings will improve, Ability to disclose and discuss suicidal ideas, Ability to demonstrate self-control will improve, Ability to identify and develop effective coping behaviors will improve and Ability to maintain clinical measurements within normal limits will improve  Physician Treatment Plan for Secondary Diagnosis: Active Problems:   MDD (major depressive disorder), recurrent episode, severe ([place])  Long Term Goal(s): Improvement in symptoms so as ready for discharge  Short Term Goals: Ability to identify changes in lifestyle to reduce recurrence of condition will improve, Ability to verbalize feelings will improve, Ability to disclose and discuss suicidal ideas, Ability to demonstrate self-control will improve, Ability to identify and develop effective coping behaviors will improve and Ability to maintain clinical measurements within normal limits will improve  I certify that inpatient services furnished can reasonably be expected to improve the patient's condition.    Sharma Covert, MD 10/22/20191:01 PM

## 2018-01-30 NOTE — Progress Notes (Signed)
D: Patient is a new admit to unit.  She states she was given trazodone early this morning, and it has made her drowsy and lethargic.  Patient asked for a multivitamin and it was given to her.  She has bright affect; she is observed talking on the phone in the day room.  She denies any thoughts of self harm.  Patient is visible in the milieu.  She has been interacting well with staff and peers.  A: Continue to monitor medication management and MD orders.  Safety checks completed every 15 minutes per protocol.  Offer support and encouragement as needed.  R: Patient is receptive to staff; her behavior is appropriate.

## 2018-01-30 NOTE — Progress Notes (Signed)
Urine sample collected. Pending results.  

## 2018-01-31 LAB — HEMOGLOBIN A1C
Hgb A1c MFr Bld: 5.2 % (ref 4.8–5.6)
Mean Plasma Glucose: 103 mg/dL

## 2018-01-31 LAB — PROLACTIN: Prolactin: 11.4 ng/mL (ref 4.8–23.3)

## 2018-01-31 MED ORDER — IBUPROFEN 400 MG PO TABS
400.0000 mg | ORAL_TABLET | Freq: Four times a day (QID) | ORAL | Status: DC | PRN
  Administered 2018-01-31: 400 mg via ORAL
  Filled 2018-01-31: qty 1

## 2018-01-31 NOTE — Therapy (Signed)
Occupational Therapy Group Note  Date:  01/31/2018 Time:  2:42 PM  Group Topic/Focus:  Yoga/Relaxation  Participation Level:  Active  Participation Quality:  Intrusive and Redirectable  Affect:  Blunted  Cognitive:  Appropriate  Insight: Lacking  Engagement in Group:  Monopolizing  Modes of Intervention:  Activity, Discussion, Education and Socialization  Additional Comments:    S: "You need to watch shows that keep you attention or read good fiction to relieve stress"  O: Education given on stress management and relaxation to develop coping skills when reintegrating into community. Healthy stress management strategies brainstormed within group, pt encouraged to contribute responses. Pt guided through relaxation chair yoga. Pt asked if pain a factor in mobility, adapted exercises to mett mobility needs and safety. PMR delivered at end of session.   A: Pt presents to group, engaged and participatory but occasionally intrusive with attention seeking behavior. Pt mention in graphic images in TV shows for attention and talking over other group members and tangentially about the self. Pt engaged  In discussion mentioning music, reading, and TV shows. Pt engaged in yoga, stating physical symptoms outloud x2, improved with redirection.  P: OT will continue to follow up for implementation of coping skills while pt acute.   Dalphine Handing, MSOT, OTR/L Behavioral Health OT/ Acute Relief OT  Dalphine Handing 01/31/2018, 2:42 PM

## 2018-01-31 NOTE — Progress Notes (Addendum)
D: Patient stated this morning she was upset because her daughter did not visit yesterday.  She stated that her husband also did not visit.  Patient spoke of ongoing conflict with her family due to them "not doing what they should do." Patient has exhibited some attention seeking behavior today.  Patient was seen in treatment team today and stated, "I think I need to see a psychiatrist; not just a nurse practictioner." Patient is not satisfied with following up with [facility].  She denies any thoughts of self harm today.  She is hoping to see her family tonight at visitation.  Patient is observed in day room interacting with her peers. Patient rates her depression as a 7; hopelessness as a 6; anxiety as a 4.  A: Continue to monitor medication management and MD orders.  Safety checks completed every 15 minutes per protocol.  Offer support and encouragement as needed.  R: Patient is receptive to staff; her behavior is appropriate.
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
## 2018-01-31 NOTE — [facility] Group Notes (Signed)
[facility] Group Therapy      01/31/2018 2:09 PM  Type of Therapy: Mental Health Association Presentation  Participation Level: Active  Participation Quality: Attentive  Affect: Appropriate  Cognitive: Oriented  Insight: Developing/Improving  Engagement in Therapy: Engaged  Modes of Intervention: Discussion, Education and Socialization  Summary of Progress/Problems: Mental Health Association (MHA) Speaker came to talk about his personal journey with mental health. The pt processed ways by which to relate to the speaker. MHA speaker provided handouts and educational information pertaining to groups and services offered by the MHA. Pt was engaged in speaker's presentation and was receptive to resources provided.    Tylan Kinn LCSWA Clinical Social Worker   

## 2018-01-31 NOTE — Progress Notes (Signed)
Pt is new to the unit early this morning during the previous night shift.  Pt states her day was ok until she spoke with her daughter.  She says that her daughter had said she was coming to visit, but when it was too late for her to ask her husband to come, her daughter said she could not come.  Pt says she and her daughter are constantly in conflict, and her failure to visit hurt her.  She says she spoke with her husband who said he would be sure to visit with her on Wednesday evening.  She contracts for safety.  She denies HI/AVH.  Writer reviewed all available meds for the evening.  Pt voiced understanding.  Support and encouragement offered.  Discharge plans are in process.  Safety maintained with q15 minute checks.

## 2018-01-31 NOTE — Progress Notes (Signed)
Recreation Therapy Notes  Date: 10.23.19 Time: 0930 Location: 300 Hall Dayroom  Group Topic: Stress Management  Goal Area(s) Addresses:  Patient will verbalize importance of using healthy stress management.  Patient will identify positive emotions associated with healthy stress management.   Intervention: Stress Management  Activity :  Guided Imagery.  LRT introduced the stress management technique of guided imagery.  LRT read Carlson script to allow patients to envision their peaceful place.  Patients were to follow along as script was read to engage in the activity.  Education:  Stress Management, Discharge Planning.   Education Outcome: Acknowledges edcuation/In group clarification offered/Needs additional education  Clinical Observations/Feedback: Pt did not attend group.      Augie Vane, LRT/CTRS         Alicia Carlson 01/31/2018 11:21 AM 

## 2018-01-31 NOTE — [facility] Suicide Risk Assessment (Signed)
[facility] INPATIENT:  Family/Significant Other Suicide Prevention Education  Suicide Prevention Education:  Education Completed; C- with husband, Eliane Hammersmith [phone]), has been identified by the patient as the family member/significant other with whom the patient will be residing, and identified as the person(s) who will aid the patient in the event of a mental health crisis (suicidal ideations/suicide attempt).  With written consent from the patient, the family member/significant other has been provided the following suicide prevention education, prior to the and/or following the discharge of the patient.  The suicide prevention education provided includes the following:  Suicide risk factors  Suicide prevention and interventions  National Suicide Hotline telephone number  [facility] assessment telephone number  [facility] Emergency Assistance 911  [facility] and/or Residential Mobile Crisis Unit telephone number  Request made of family/significant other to: Remove weapons (e.g., guns, rifles, knives), all items previously/currently identified as safety concern.  Rhona Fusilier stated that he has all weapons secured in a safe in the home.  Remove drugs/medications (over-the-counter, prescriptions, illicit drugs), all items previously/currently identified as a safety concern.   The family member/significant other verbalizes understanding of the suicide prevention education information provided.  The family member/significant other agrees to remove the items of safety concern listed above.  Cherie Bohaboy 01/31/2018, 2:18 PM

## 2018-01-31 NOTE — Tx Team (Signed)
Interdisciplinary Treatment and Diagnostic Plan Update  01/31/2018 Time of Session: 9:40am Alicia Carlson MRN: [id number]  Principal Diagnosis: <principal problem not specified>  Secondary Diagnoses: Active Problems:   MDD (major depressive disorder), recurrent episode, severe (HCC)   Current Medications:  Current Facility-Administered Medications  Medication Dose Route Frequency Provider Last Rate Last Dose  . acetaminophen (TYLENOL) tablet 650 mg  650 mg Oral Q6H PRN Kerry Hough, PA-C      . alum & mag hydroxide-simeth (MAALOX/MYLANTA) 200-200-20 MG/5ML suspension 30 mL  30 mL Oral Q4H PRN Kerry Hough, PA-C      . DULoxetine (CYMBALTA) DR capsule 90 mg  90 mg Oral Daily Antonieta Pert, MD   90 mg at 01/31/18 4098  . hydrOXYzine (ATARAX/VISTARIL) tablet 25 mg  25 mg Oral Q6H PRN Kerry Hough, PA-C   25 mg at 01/30/18 1815  . ibuprofen (ADVIL,MOTRIN) tablet 400 mg  400 mg Oral Q6H PRN Antonieta Pert, MD      . lamoTRIgine (LAMICTAL) tablet 100 mg  100 mg Oral BID Antonieta Pert, MD   100 mg at 01/31/18 0807  . magnesium hydroxide (MILK OF MAGNESIA) suspension 30 mL  30 mL Oral Daily PRN Donell Sievert E, PA-C      . mirtazapine (REMERON) tablet 15 mg  15 mg Oral QHS Antonieta Pert, MD   15 mg at 01/30/18 2127  . multivitamin with minerals tablet 1 tablet  1 tablet Oral Daily Antonieta Pert, MD   1 tablet at 01/31/18 1191  . topiramate (TOPAMAX) tablet 50 mg  50 mg Oral BID Antonieta Pert, MD   50 mg at 01/31/18 4782  . traZODone (DESYREL) tablet 50 mg  50 mg Oral QHS,MR X 1 Antonieta Pert, MD   50 mg at 01/30/18 2127   PTA Medications: Medications Prior to Admission  Medication Sig Dispense Refill Last Dose  . DULoxetine (CYMBALTA) 60 MG capsule Take 60 mg by mouth daily.   01/30/2018 at Unknown time  . FLUoxetine (PROZAC) 20 MG capsule Take 20 mg by mouth daily.   01/30/2018 at Unknown time  . hydrOXYzine (ATARAX/VISTARIL) 25 MG tablet Take 25  mg by mouth every 8 (eight) hours as needed for anxiety.   01/29/2018 at Unknown time  . lamoTRIgine (LAMICTAL) 100 MG tablet Take 100 mg by mouth 2 (two) times daily.   01/30/2018 at Unknown time  . Multiple Vitamins-Minerals (ADULT ONE DAILY GUMMIES) CHEW Chew 2 tablets by mouth daily.   01/29/2018 at Unknown time  . topiramate (TOPAMAX) 25 MG tablet Take 50 mg by mouth 2 (two) times daily.   01/30/2018 at Unknown time    Patient Stressors: Financial difficulties Marital or family conflict  Patient Strengths: Average or above average intelligence Communication skills General fund of knowledge Physical Health Supportive family/friends Work skills  Treatment Modalities: Medication Management, Group therapy, Case management,  1 to 1 session with clinician, Psychoeducation, Recreational therapy.   Physician Treatment Plan for Primary Diagnosis: <principal problem not specified> Long Term Goal(s): Improvement in symptoms so as ready for discharge Improvement in symptoms so as ready for discharge   Short Term Goals: Ability to identify changes in lifestyle to reduce recurrence of condition will improve Ability to verbalize feelings will improve Ability to disclose and discuss suicidal ideas Ability to demonstrate self-control will improve Ability to identify and develop effective coping behaviors will improve Ability to maintain clinical measurements within normal limits will improve Ability to identify  changes in lifestyle to reduce recurrence of condition will improve Ability to verbalize feelings will improve Ability to disclose and discuss suicidal ideas Ability to demonstrate self-control will improve Ability to identify and develop effective coping behaviors will improve Ability to maintain clinical measurements within normal limits will improve  Medication Management: Evaluate patient's response, side effects, and tolerance of medication regimen.  Therapeutic Interventions:  1 to 1 sessions, Unit Group sessions and Medication administration.  Evaluation of Outcomes: Progressing  Physician Treatment Plan for Secondary Diagnosis: Active Problems:   MDD (major depressive disorder), recurrent episode, severe (HCC)  Long Term Goal(s): Improvement in symptoms so as ready for discharge Improvement in symptoms so as ready for discharge   Short Term Goals: Ability to identify changes in lifestyle to reduce recurrence of condition will improve Ability to verbalize feelings will improve Ability to disclose and discuss suicidal ideas Ability to demonstrate self-control will improve Ability to identify and develop effective coping behaviors will improve Ability to maintain clinical measurements within normal limits will improve Ability to identify changes in lifestyle to reduce recurrence of condition will improve Ability to verbalize feelings will improve Ability to disclose and discuss suicidal ideas Ability to demonstrate self-control will improve Ability to identify and develop effective coping behaviors will improve Ability to maintain clinical measurements within normal limits will improve     Medication Management: Evaluate patient's response, side effects, and tolerance of medication regimen.  Therapeutic Interventions: 1 to 1 sessions, Unit Group sessions and Medication administration.  Evaluation of Outcomes: Progressing   RN Treatment Plan for Primary Diagnosis: <principal problem not specified> Long Term Goal(s): Knowledge of disease and therapeutic regimen to maintain health will improve  Short Term Goals: Ability to participate in decision making will improve, Ability to verbalize feelings will improve, Ability to disclose and discuss suicidal ideas and Ability to identify and develop effective coping behaviors will improve  Medication Management: RN will administer medications as ordered by provider, will assess and evaluate patient's response and  provide education to patient for prescribed medication. RN will report any adverse and/or side effects to prescribing provider.  Therapeutic Interventions: 1 on 1 counseling sessions, Psychoeducation, Medication administration, Evaluate responses to treatment, Monitor vital signs and CBGs as ordered, Perform/monitor CIWA, COWS, AIMS and Fall Risk screenings as ordered, Perform wound care treatments as ordered.  Evaluation of Outcomes: Progressing   LCSW Treatment Plan for Primary Diagnosis: <principal problem not specified> Long Term Goal(s): Safe transition to appropriate next level of care at discharge, Engage patient in therapeutic group addressing interpersonal concerns.  Short Term Goals: Engage patient in aftercare planning with referrals and resources  Therapeutic Interventions: Assess for all discharge needs, 1 to 1 time with Social worker, Explore available resources and support systems, Assess for adequacy in community support network, Educate family and significant other(s) on suicide prevention, Complete Psychosocial Assessment, Interpersonal group therapy.  Evaluation of Outcomes: Progressing   Progress in Treatment: Attending groups: Yes. Participating in groups: Yes. Taking medication as prescribed: Yes. Toleration medication: Yes. Family/Significant other contact made: No, will contact:  the patient's husband Patient understands diagnosis: Yes. Discussing patient identified problems/goals with staff: Yes. Medical problems stabilized or resolved: Yes. Denies suicidal/homicidal ideation: Yes. Issues/concerns per patient self-inventory: No. Other:   New problem(s) identified: None   New Short Term/Long Term Goal(s):medication stabilization, elimination of SI thoughts, development of comprehensive mental wellness plan.    Patient Goals:  "I want to be referred to a new psychiatrist and continue with my therapy  sessions"  Discharge Plan or Barriers: Patient plans to  discharge home with husband and continue to follow up with [facility]. Patient expressed interest in being referred to [facility] for outpatient medication management services. CSW will follow and assess for appropriate referrals.   Reason for Continuation of Hospitalization: Anxiety Depression Medication stabilization  Estimated Length of Stay: 3-5 days   Attendees: Patient: Alicia Carlson  01/31/2018 11:21 AM  Physician: Dr. Landry Mellow, MD 01/31/2018 11:21 AM  Nursing: [place] Citizen.Leonard Schwartz, RN 01/31/2018 11:21 AM  RN Care Manager: Juliann Pares 01/31/2018 11:21 AM  Social Worker: Baldo Daub, LCSWA 01/31/2018 11:21 AM  Recreational Therapist: Juliann Pares 01/31/2018 11:21 AM  Other: Reola Calkins, NP 01/31/2018 11:21 AM  Other:  01/31/2018 11:21 AM  Other: 01/31/2018 11:21 AM    Scribe for Treatment Team: Maeola Sarah, LCSWA 01/31/2018 11:21 AM

## 2018-02-01 MED ORDER — TRAZODONE HCL 50 MG PO TABS: 50.0000 mg | ORAL_TABLET | Freq: Every evening | ORAL | 0 refills | Status: DC | PRN

## 2018-02-01 MED ORDER — DULOXETINE HCL 30 MG PO CPEP: 90.0000 mg | ORAL_CAPSULE | Freq: Every day | ORAL | 0 refills | Status: DC

## 2018-02-01 MED ORDER — MIRTAZAPINE 15 MG PO TABS: 15.0000 mg | ORAL_TABLET | Freq: Every day | ORAL | 0 refills | Status: DC

## 2018-02-01 MED ORDER — LAMOTRIGINE 100 MG PO TABS: 100.0000 mg | ORAL_TABLET | Freq: Two times a day (BID) | ORAL | 0 refills | Status: DC

## 2018-02-01 MED ORDER — TOPIRAMATE 25 MG PO TABS: 50.0000 mg | ORAL_TABLET | Freq: Two times a day (BID) | ORAL | 0 refills | Status: DC

## 2018-02-01 MED ORDER — HYDROXYZINE HCL 25 MG PO TABS: 25.0000 mg | ORAL_TABLET | Freq: Four times a day (QID) | ORAL | 0 refills | Status: DC | PRN

## 2018-02-01 NOTE — Progress Notes (Signed)
Pt reports she is doing better this evening.  She says that her daughter did come to visit her this evening and it went well.  She says her husband is coming tomorrow night.  Pt denies SI/HI/AVH at this time.  She voiced no needs or concerns to Clinical research associate.  Pt has been in the dayroom all evening and attended evening group.  Support and encouragement offered.  Meds given as ordered.  Discharge plans are in process.  Safety maintained with q15 minute checks.

## 2018-02-01 NOTE — Plan of Care (Signed)
  Problem: Education: Goal: Utilization of techniques to improve thought processes will improve Outcome: Adequate for Discharge Goal: Knowledge of the prescribed therapeutic regimen will improve Outcome: Adequate for Discharge   Problem: Activity: Goal: Interest or engagement in leisure activities will improve Outcome: Adequate for Discharge Goal: Imbalance in normal sleep/wake cycle will improve Outcome: Adequate for Discharge   Problem: Coping: Goal: Coping ability will improve Outcome: Adequate for Discharge Goal: Will verbalize feelings Outcome: Adequate for Discharge   Problem: Health Behavior/Discharge Planning: Goal: Ability to make decisions will improve Outcome: Adequate for Discharge Goal: Compliance with therapeutic regimen will improve Outcome: Adequate for Discharge   Problem: Role Relationship: Goal: Will demonstrate positive changes in social behaviors and relationships Outcome: Adequate for Discharge   Problem: Safety: Goal: Ability to disclose and discuss suicidal ideas will improve Outcome: Adequate for Discharge Goal: Ability to identify and utilize support systems that promote safety will improve Outcome: Adequate for Discharge   Problem: Self-Concept: Goal: Will verbalize positive feelings about self Outcome: Adequate for Discharge Goal: Level of anxiety will decrease Outcome: Adequate for Discharge   Problem: Education: Goal: Ability to make informed decisions regarding treatment will improve Outcome: Adequate for Discharge   Problem: Coping: Goal: Coping ability will improve Outcome: Adequate for Discharge   Problem: Health Behavior/Discharge Planning: Goal: Identification of resources available to assist in meeting health care needs will improve Outcome: Adequate for Discharge   Problem: Medication: Goal: Compliance with prescribed medication regimen will improve Outcome: Adequate for Discharge   Problem: Self-Concept: Goal: Ability to  disclose and discuss suicidal ideas will improve Outcome: Adequate for Discharge Goal: Will verbalize positive feelings about self Outcome: Adequate for Discharge   Problem: Education: Goal: Knowledge of [place] [facility] Education information/materials will improve Outcome: Adequate for Discharge Goal: Emotional status will improve Outcome: Adequate for Discharge Goal: Mental status will improve Outcome: Adequate for Discharge Goal: Verbalization of understanding the information provided will improve Outcome: Adequate for Discharge   Problem: Activity: Goal: Interest or engagement in activities will improve Outcome: Adequate for Discharge Goal: Sleeping patterns will improve Outcome: Adequate for Discharge   Problem: Coping: Goal: Ability to verbalize frustrations and anger appropriately will improve Outcome: Adequate for Discharge Goal: Ability to demonstrate self-control will improve Outcome: Adequate for Discharge   Problem: Health Behavior/Discharge Planning: Goal: Identification of resources available to assist in meeting health care needs will improve Outcome: Adequate for Discharge Goal: Compliance with treatment plan for underlying cause of condition will improve Outcome: Adequate for Discharge   Problem: Physical Regulation: Goal: Ability to maintain clinical measurements within normal limits will improve Outcome: Adequate for Discharge   Problem: Safety: Goal: Periods of time without injury will increase Outcome: Adequate for Discharge   Problem: Spiritual Needs Goal: Ability to function at adequate level Outcome: Adequate for Discharge

## 2018-02-01 NOTE — BHH Suicide Risk Assessment (Signed)
[facility] Discharge Suicide Risk Assessment   Principal Problem: MDD (major depressive disorder), recurrent episode, severe (HCC) Discharge Diagnoses:  Patient Active Problem List   Diagnosis Date Noted  . MDD (major depressive disorder), recurrent episode, severe (HCC) [F33.2] 01/30/2018    Total Time spent with patient: 15 minutes  Musculoskeletal: Strength & Muscle Tone: within normal limits Gait & Station: normal Patient leans: N/A  Psychiatric Specialty Exam: Review of Systems  All other systems reviewed and are negative.   Blood pressure 97/63, pulse (!) 101, temperature 98.5 F (36.9 C), temperature source Oral, resp. rate 20, height 5\' 2"  (1.575 m), weight 74.4 kg.Body mass index is 30 kg/m.  General Appearance: Casual  Eye Contact::  Fair  Speech:  Normal Rate409  Volume:  Normal  Mood:  Anxious  Affect:  Congruent  Thought Process:  Coherent and Descriptions of Associations: Intact  Orientation:  Full (Time, Place, and Person)  Thought Content:  Logical  Suicidal Thoughts:  No  Homicidal Thoughts:  No  Memory:  Immediate;   Fair Recent;   Fair Remote;   Fair  Judgement:  Intact  Insight:  Fair  Psychomotor Activity:  Increased  Concentration:  Fair  Recall:  [employer] of Knowledge:Fair  Language: Fair  Akathisia:  Negative  Handed:  Right  AIMS (if indicated):     Assets:  Communication Skills Desire for Improvement Financial Resources/Insurance Housing Physical Health Resilience Social Support  Sleep:  Number of Hours: 6.75  Cognition: WNL  ADL's:  Intact   Mental Status Per Nursing Assessment::   On Admission:  Suicidal ideation indicated by patient, Suicide plan  Demographic Factors:  Caucasian and Unemployed  Loss Factors: NA  Historical Factors: Impulsivity  Risk Reduction Factors:   Sense of responsibility to family, Living with another person, especially a relative and Positive social support  Continued Clinical Symptoms:  Bipolar  Disorder:   Bipolar II Personality Disorders:   Cluster B  Cognitive Features That Contribute To Risk:  None    Suicide Risk:  Minimal: No identifiable suicidal ideation.  Patients presenting with no risk factors but with morbid ruminations; may be classified as minimal risk based on the severity of the depressive symptoms  Follow-up Information    [facility]. Go on 02/06/2018.   Why:  Appointment for medication management services is Tuesday, 02/06/18 at 10:30am. Please be sure to bring your Photo ID and any discharge paperwork from this hospitalization, including your medications.  Contact information: [address] [place] [address] [phone]        [facility]. Go on 02/06/2018.   Why:  Appointment for therapy services is Tuesday, 02/06/2018 at 3:00pm. Please be sure to bring your Photo ID and any discharge paperwork from this hospitalization.  Contact information: [address] [place], [place] [address]  Phone: [phone] Fax: [phone]          Plan Of Care/Follow-up recommendations:  Activity:  ad lib  Antonieta Pert, MD 02/01/2018, 1:10 PM

## 2018-02-01 NOTE — Progress Notes (Addendum)
Discharge note: Patient reviewed discharge paperwork with RN including prescriptions, follow up appointments, and lab work. Patient given the opportunity to ask questions. All concerns were addressed. All belongings were returned to patient. Denied SI/HI/AVH. Patient thanked staff for their care while at the hospital.  Patient was discharged to lobby where her husband was waiting to pick her up. 

## 2018-02-01 NOTE — Progress Notes (Signed)
  [facility] Adult Case Management Discharge Plan :  Will you be returning to the same living situation after discharge:  Yes,  patient reports she is returning home with her husband At discharge, do you have transportation home?: Yes,  patient reports her husband will pick her up at discharge Do you have the ability to pay for your medications: Yes,  Medicaid, Medicare, SSDI, support from spouse  Release of information consent forms completed and in the chart;  Patient's signature needed at discharge.  Patient to Follow up at: Follow-up Information    [facility]. Go on 02/06/2018.   Why:  Appointment for medication management services is Tuesday, 02/06/18 at 10:30am. Please be sure to bring your Photo ID and any discharge paperwork from this hospitalization, including your medications.  Contact information: [address] [place] [address] [phone]        [facility]. Go on 02/06/2018.   Why:  Appointment for therapy services is Tuesday, 02/06/2018 at 3:00pm. Please be sure to bring your Photo ID and any discharge paperwork from this hospitalization.  Contact information: [address], [place], [place] [address]  Phone: [phone] Fax: [phone]          Next level of care provider has access to [facility] Link:yes  Safety Planning and Suicide Prevention discussed: Yes,  with the patient's husband  Have you used any form of tobacco in the last 30 days? (Cigarettes, Smokeless Tobacco, Cigars, and/or Pipes): No  Has patient been referred to the Quitline?: N/A patient is not a smoker  Patient has been referred for addiction treatment: N/A  Maeola Sarah, LCSWA 02/01/2018, 10:11 AM

## 2018-02-01 NOTE — Discharge Summary (Signed)
Physician Discharge Summary Note  Patient:  Alicia Carlson is an 42 y.o., female MRN:  [id number]  DOB:  Nov 30, 1975  Patient phone:  [phone] (home)   Patient address:   [address] [place] [address],   Total Time spent with patient: Greater than 30 minutes  Date of Admission:  01/29/2018  Date of Discharge: 02-01-18  Reason for Admission: Worsening symptoms of depression with suicidal thoughts.  Principal Problem: MDD (major depressive disorder), recurrent episode, severe [facility])  Discharge Diagnoses: Patient Active Problem List   Diagnosis Date Noted  . MDD (major depressive disorder), recurrent episode, severe (HCC) [F33.2] 01/30/2018    Priority: High   Past Psychiatric History: MDD  Past Medical History:  Past Medical History:  Diagnosis Date  . Medical history non-contributory    History reviewed. No pertinent surgical history. Family History: History reviewed. No pertinent family history.  Family Psychiatric  History: See H&P  Social History:  Social History   Substance and Sexual Activity  Alcohol Use Not Currently     Social History   Substance and Sexual Activity  Drug Use Never    Social History   Socioeconomic History  . Marital status: Married    Spouse name: Not on file  . Number of children: Not on file  . Years of education: Not on file  . Highest education level: Not on file  Occupational History  . Not on file  Social Needs  . Financial resource strain: Not on file  . Food insecurity:    Worry: Not on file    Inability: Not on file  . Transportation needs:    Medical: Not on file    Non-medical: Not on file  Tobacco Use  . Smoking status: Never Smoker  . Smokeless tobacco: Never Used  Substance and Sexual Activity  . Alcohol use: Not Currently  . Drug use: Never  . Sexual activity: Not on file  Lifestyle  . Physical activity:    Days per week: Not on file    Minutes per session: Not on file  . Stress: Not on file   Relationships  . Social connections:    Talks on phone: Not on file    Gets together: Not on file    Attends religious service: Not on file    Active member of club or organization: Not on file    Attends meetings of clubs or organizations: Not on file    Relationship status: Not on file  Other Topics Concern  . Not on file  Social History Narrative  . Not on file   Hospital Course: (Per Md's admission evaluation):Patient is a 41 year old female with a past psychiatric history reported to be bipolar disorder type II who presented as a walk-in to the behavioral health hospital on 01/30/2018. Patient stated that she been having worsening depressive illness over the last several weeks. She stated that she had been thinking about killing herself. She told her husband on the date of admission that she had a plan to "slit my wrists". The major stressor in herlife is her daughter. The patient stated that her daughter moved out a year ago, and had significant mental health problems. The patient's been sending the daughter money for the last year. She stopped giving money to the daughter proximally a month ago, and the daughter is been more enraged towards her. She actually stated that she thought her mother (the patient) was "stalking her". Patient stated that she is been depressed since the  day after she gave birth to her daughter. She is followed at the local mental health center in [place], and sees a therapist. She was unsure of whether or not they worked on [employer]. She has been on multiple medications in the past. She thought the Cymbalta that she is currently taking was the most effective. She was admitted to the hospital for evaluation and stabilization.  [name] was admitted to the hospital for worsening symptoms of depression & crisis management due suicidal thoughts with plans. She was in need of mood stabilization treatments. After the above admission assessment, her  presenting symptoms were identified. The medication regimen targeting those symptoms were discussed & initiated with her consent. She was medicated & discharged on; Duloxetine 90 mg for depression, Hydroxyzine 25 mg prn for anxiety, Lamictal 100 mg for mood stabilization, Mirtazapine 15 mg for depression/insomnia, Topamax 50 mg po for mood stabilization & Trazodone 50 mg prn for insomnia. She presented no other pre-existing medical problems that required treatment. She was enrolled & participated in the group counseling sessions being offered & held on this unit. She learned coping skills..  During the course of her hospitalization, Shyanna's improvement was monitored by observation & her daily report of symptom reduction noted.  Her emotional & mental status were monitored by daily self-inventory reports completed by her & the clinical staff. She was evaluated daily by the treatment team for mood stability & plans for continued recovery after discharge. She was offered further treatment options upon discharge on an outpatient basis as noted below. She was provided with all the necessary information needed to make this appoint without problems.   Upon discharge, [name] was both mentally and medically stable. She denies suicidal/homicidal ideations, auditory/visual/tactile hallucinations, delusional thoughts or paranoia. She left [facility] with all belongings in no distress.   Physical Findings: AIMS: Facial and Oral Movements Muscles of Facial Expression: None, normal Lips and Perioral Area: None, normal Jaw: None, normal Tongue: None, normal,Extremity Movements Upper (arms, wrists, hands, fingers): None, normal Lower (legs, knees, ankles, toes): None, normal, Trunk Movements Neck, shoulders, hips: None, normal, Overall Severity Severity of abnormal movements (highest score from questions above): None, normal Incapacitation due to abnormal movements: None, normal Patient's awareness of abnormal movements (rate  only patient's report): No Awareness, Dental Status Current problems with teeth and/or dentures?: No Does patient usually wear dentures?: No  CIWA:    COWS:     Musculoskeletal: Strength & Muscle Tone: within normal limits Gait & Station: normal Patient leans: N/A  Psychiatric Specialty Exam: Physical Exam  Nursing note and vitals reviewed. Constitutional: She appears well-developed.  HENT:  Head: Normocephalic.  Eyes: Pupils are equal, round, and reactive to light.  Neck: Normal range of motion.  Cardiovascular: Normal rate.  Respiratory: Effort normal.  GI: Soft.  Genitourinary:  Genitourinary Comments: Deferred  Musculoskeletal: Normal range of motion.  Neurological: She is alert.  Skin: Skin is warm.    Review of Systems  Constitutional: Negative.   HENT: Negative.   Eyes: Negative.   Respiratory: Negative.   Cardiovascular: Negative.   Gastrointestinal: Negative.   Genitourinary: Negative.   Skin: Negative.   Neurological: Negative.   Endo/Heme/Allergies: Negative.   Psychiatric/Behavioral: Positive for depression (Stable). Negative for hallucinations, memory loss, substance abuse and suicidal ideas. The patient has insomnia (Stable). The patient is not nervous/anxious.     Blood pressure 97/63, pulse (!) 101, temperature 98.5 F (36.9 C), temperature source Oral, resp. rate 20, height 5\' 2"  (1.575 m),  weight 74.4 kg.Body mass index is 30 kg/m.  See Md's SRA   Have you used any form of tobacco in the last 30 days? (Cigarettes, Smokeless Tobacco, Cigars, and/or Pipes): No  Has this patient used any form of tobacco in the last 30 days? (Cigarettes, Smokeless Tobacco, Cigars, and/or Pipes): N/A  Blood Alcohol level:  No results found for: [facility]  Metabolic Disorder Labs:  Lab Results  Component Value Date   HGBA1C 5.2 01/30/2018   MPG 103 01/30/2018   Lab Results  Component Value Date   PROLACTIN 11.4 01/30/2018   Lab Results  Component Value Date   CHOL  291 (H) 01/30/2018   TRIG 191 (H) 01/30/2018   HDL 52 01/30/2018   CHOLHDL 5.6 01/30/2018   VLDL 38 01/30/2018   LDLCALC 201 (H) 01/30/2018   See Psychiatric Specialty Exam and Suicide Risk Assessment completed by Attending Physician prior to discharge.  Discharge destination:  Home  Is patient on multiple antipsychotic therapies at discharge:  No   Has Patient had three or more failed trials of antipsychotic monotherapy by history:  No  Recommended Plan for Multiple Antipsychotic Therapies: NA  Allergies as of 02/01/2018   No Known Allergies     Medication List    STOP taking these medications   ADULT ONE DAILY GUMMIES Chew   FLUoxetine 20 MG capsule Commonly known as:  PROZAC     TAKE these medications     Indication  DULoxetine 30 MG capsule Commonly known as:  CYMBALTA Take 3 capsules (90 mg total) by mouth daily. For depression Start taking on:  02/02/2018 What changed:    medication strength  how much to take  additional instructions  Indication:  Major Depressive Disorder   hydrOXYzine 25 MG tablet Commonly known as:  ATARAX/VISTARIL Take 1 tablet (25 mg total) by mouth every 6 (six) hours as needed for anxiety. What changed:  when to take this  Indication:  Feeling Anxious   lamoTRIgine 100 MG tablet Commonly known as:  LAMICTAL Take 1 tablet (100 mg total) by mouth 2 (two) times daily. For mood stabilization What changed:  additional instructions  Indication:  Mood stabilization   mirtazapine 15 MG tablet Commonly known as:  REMERON Take 1 tablet (15 mg total) by mouth at bedtime. For depression/sleep  Indication:  Major Depressive Disorder, Sleep   topiramate 25 MG tablet Commonly known as:  TOPAMAX Take 2 tablets (50 mg total) by mouth 2 (two) times daily. For mood stabilization What changed:  additional instructions  Indication:  Mood stabilization   traZODone 50 MG tablet Commonly known as:  DESYREL Take 1 tablet (50 mg total) by  mouth at bedtime as needed for sleep.  Indication:  Trouble Sleeping      Follow-up [employer] [employer]. Go on 02/06/2018.   Why:  Appointment for medication management services is Tuesday, 02/06/18 at 10:30am. Please be sure to bring your Photo ID and any discharge paperwork from this hospitalization, including your medications.  Contact information: [address] [place] [address] [phone]        [facility]. Go on 02/06/2018.   Why:  Appointment for therapy services is Tuesday, 02/06/2018 at 3:00pm. Please be sure to bring your Photo ID and any discharge paperwork from this hospitalization.  Contact information: [address], [place], [place] [address]  Phone: [phone] Fax: [phone]         Follow-up recommendations: Activity:  As tolerated Diet: As recommended by your primary care doctor. Keep all scheduled follow-up appointments as recommended.    Comments: Patient is instructed prior to discharge to: Take all medications as prescribed by his/her mental healthcare provider. Report any adverse effects and or reactions from the medicines to his/her outpatient provider promptly. Patient has been instructed & cautioned: To not engage in alcohol and or illegal drug use while on prescription medicines. In the event of worsening symptoms, patient is instructed to call the crisis hotline, 911 and or go to the nearest ED for appropriate evaluation and treatment of symptoms. To follow-up with his/her primary care provider for your other medical issues, concerns and or health care needs.   Signed: Armandina Stammer, NP, PMHNP, FNP-BC. 02/01/2018, 10:04 AM

## 2018-04-11 DIAGNOSIS — N183 Chronic kidney disease, stage 3 unspecified: Secondary | ICD-10-CM

## 2018-04-11 HISTORY — DX: Chronic kidney disease, stage 3 unspecified: N18.30

## 2018-11-19 ENCOUNTER — Other Ambulatory Visit (HOSPITAL_BASED_OUTPATIENT_CLINIC_OR_DEPARTMENT_OTHER): Payer: Self-pay

## 2018-11-19 DIAGNOSIS — R5383 Other fatigue: Secondary | ICD-10-CM

## 2018-11-19 DIAGNOSIS — R0683 Snoring: Secondary | ICD-10-CM

## 2018-12-04 ENCOUNTER — Encounter (HOSPITAL_BASED_OUTPATIENT_CLINIC_OR_DEPARTMENT_OTHER): Payer: Self-pay | Admitting: Internal Medicine

## 2018-12-05 ENCOUNTER — Encounter (HOSPITAL_BASED_OUTPATIENT_CLINIC_OR_DEPARTMENT_OTHER): Payer: Self-pay | Admitting: Internal Medicine

## 2018-12-08 ENCOUNTER — Other Ambulatory Visit (HOSPITAL_COMMUNITY)
Admission: RE | Admit: 2018-12-08 | Discharge: 2018-12-08 | Disposition: A | Discharge status: Home / Self Care | Payer: HRSA Program | Source: Ambulatory Visit | Attending: Internal Medicine | Admitting: Internal Medicine

## 2018-12-08 DIAGNOSIS — Z20828 Contact with and (suspected) exposure to other viral communicable diseases: Secondary | ICD-10-CM | POA: Insufficient documentation

## 2018-12-08 DIAGNOSIS — Z01812 Encounter for preprocedural laboratory examination: Secondary | ICD-10-CM | POA: Insufficient documentation

## 2018-12-08 LAB — SARS CORONAVIRUS 2 (TAT 6-24 HRS): SARS Coronavirus 2: NEGATIVE

## 2018-12-11 ENCOUNTER — Other Ambulatory Visit: Payer: Self-pay

## 2018-12-11 ENCOUNTER — Ambulatory Visit (HOSPITAL_BASED_OUTPATIENT_CLINIC_OR_DEPARTMENT_OTHER): Payer: Medicare Other | Attending: Nurse Practitioner | Admitting: Internal Medicine

## 2018-12-11 VITALS — Temp 98.6°F | Ht 62.0 in | Wt 170.0 lb

## 2018-12-11 DIAGNOSIS — R0683 Snoring: Secondary | ICD-10-CM

## 2018-12-11 DIAGNOSIS — R5383 Other fatigue: Secondary | ICD-10-CM

## 2018-12-11 DIAGNOSIS — G479 Sleep disorder, unspecified: Secondary | ICD-10-CM | POA: Diagnosis not present

## 2018-12-11 DIAGNOSIS — G4733 Obstructive sleep apnea (adult) (pediatric): Secondary | ICD-10-CM | POA: Insufficient documentation

## 2018-12-12 ENCOUNTER — Ambulatory Visit (HOSPITAL_BASED_OUTPATIENT_CLINIC_OR_DEPARTMENT_OTHER): Payer: Self-pay | Attending: Nurse Practitioner | Admitting: Internal Medicine

## 2018-12-12 DIAGNOSIS — G4711 Idiopathic hypersomnia with long sleep time: Secondary | ICD-10-CM | POA: Insufficient documentation

## 2018-12-12 DIAGNOSIS — R0683 Snoring: Secondary | ICD-10-CM | POA: Insufficient documentation

## 2018-12-12 DIAGNOSIS — R5383 Other fatigue: Secondary | ICD-10-CM | POA: Insufficient documentation

## 2018-12-16 DIAGNOSIS — R0683 Snoring: Secondary | ICD-10-CM

## 2018-12-16 DIAGNOSIS — R5383 Other fatigue: Secondary | ICD-10-CM

## 2018-12-16 NOTE — Procedures (Signed)
Patient Name: Alicia Carlson, Seefeld Date: 12/11/2018 Gender: Female D.O.B: 09/25/1975 Age (years): 26 Referring Provider: Lucita Lora NP Height (inches): 62 Interpreting Physician: Baird Lyons MD, ABSM Weight (lbs): 170 RPSGT: Zadie Rhine BMI: 31 MRN: [id number] Neck Size: 14.00  CLINICAL INFORMATION Sleep Study Type: NPSG Indication for sleep study: Fatigue, Snoring Epworth Sleepiness Score: 10  SLEEP STUDY TECHNIQUE As per the AASM Manual for the Scoring of Sleep and Associated Events v2.3 (April 2016) with a hypopnea requiring 4% desaturations.  The channels recorded and monitored were frontal, central and occipital EEG, electrooculogram (EOG), submentalis EMG (chin), nasal and oral airflow, thoracic and abdominal wall motion, anterior tibialis EMG, snore microphone, electrocardiogram, and pulse oximetry.  MEDICATIONS Medications self-administered by patient taken the night of the study : CLONAZEPAM  SLEEP ARCHITECTURE The study was initiated at 10:42:52 PM and ended at 6:01:09 AM.  Sleep onset time was 241.0 minutes and the sleep efficiency was 44.8%%. The total sleep time was 196.5 minutes.  Stage REM latency was 31.0 minutes.  The patient spent 0.5%% of the night in stage N1 sleep, 27.5%% in stage N2 sleep, 2.8%% in stage N3 and 69.2% in REM.  Alpha intrusion was absent.  Supine sleep was 76.64%.  RESPIRATORY PARAMETERS The overall apnea/hypopnea index (AHI) was 10.4 per hour. There were 4 total apneas, including 4 obstructive, 0 central and 0 mixed apneas. There were 30 hypopneas and 34 RERAs.  The AHI during Stage REM sleep was 14.1 per hour.  AHI while supine was 12.4 per hour.  The mean oxygen saturation was 95.5%. The minimum SpO2 during sleep was 87.0%.  moderate snoring was noted during this study.  CARDIAC DATA The 2 lead EKG demonstrated sinus rhythm. The mean heart rate was 76.4 beats per minute. Other EKG findings include: None.  LEG MOVEMENT  DATA The total PLMS were 0 with a resulting PLMS index of 0.0. Associated arousal with leg movement index was 0.0 .  IMPRESSIONS - Mild obstructive sleep apnea occurred during this study (AHI = 10.4/h). - No significant central sleep apnea occurred during this study (CAI = 0.0/h). - Mild oxygen desaturation was noted during this study (Min O2 = 87.0%). Mean sat 95.5%. - The patient snored with moderate snoring volume. - No cardiac abnormalities were noted during this study. - Clinically significant periodic limb movements did not occur during sleep. No significant associated arousals. - Patient reported anxiety delaying sleep onset until about 2:30 AM. She took clonazepam 0.5 mg at 2:03 AM. - She had not taken potentially REM-suppresing antidepressant meds since 8/18. Increased REM percentage after 2:30 AM might reflect REM rebound off these medcations.  DIAGNOSIS - Obstructive Sleep Apnea (327.23 [G47.33 ICD-10]) - Sleep Disorder  RECOMMENDATIONS - Treatment for mild OSA is directed at symptoms. Conservative management may include observation, weight loss and sleep position off back. - Other measures for OSA, such as CPAP or a fitted oral appliance, would be based on clinical judgment. - Consider treatment for insomnia if this is a comon problem in the home environment. - See result of MSLT following this study. - Sleep hygiene should be reviewed to assess factors that may improve sleep quality. - Weight management and regular exercise should be initiated or continued if appropriate.  [Electronically signed] 12/16/2018 12:42 PM  Baird Lyons MD, [place], [place] Board of Sleep Medicine   NPI: [id number]  Jetty [place] Ryin Ambrosius Diplomate, Biomedical engineerAmerican Board of Sleep Medicine  ELECTRONICALLY SIGNED ON:  12/16/2018, 12:31 PM [facility] PH: [phone]   FX: [phone] ACCREDITED BY THE AMERICAN ACADEMY OF  SLEEP MEDICINE

## 2018-12-16 NOTE — Procedures (Signed)
   Patient Name: Alicia Carlson, Alicia Carlson Date: 12/12/2018 Gender: Female D.O.B: 01/22/1976 Age (years): 16 Referring Provider: Lucita Lora NP Height (inches): 62 Interpreting Physician: Baird Lyons MD, ABSM Weight (lbs): 170 RPSGT: Alicia Carlson BMI: 31 MRN: [id number] Neck Size: 14.00  CLINICAL INFORMATION Sleep Study Type: MSLT The patient was referred to the sleep center for evaluation of daytime sleepiness. Epworth Sleepiness Score: 10  SLEEP STUDY TECHNIQUE A Multiple Sleep Latency Test was performed after an overnight polysomnogram according to the AASM scoring manual v2.3 (April 2016) and clinical guidelines. Five nap opportunities occurred over the course of the test which followed an overnight polysomnogram. The channels recorded and monitored were frontal, central, and occipital electroencephalography (EEG), right and left electrooculogram (EOG), chin electromyography (EMG), and electrocardiogram (EKG).  MEDICATIONS Medications taken by the patient : CLONAZEPAM   IMPRESSIONS - Total number of naps attempted: 5 . Total number of naps with sleep attained: 5. The Mean Sleep Latency was 06:52 minutes.  - The patient appears to have pathologic sleepiness, evidenced by a short mean sleep latency (8 minutes or less) on this MSLT. - No sleep onset REMs were noted during this MSLT. - Delayed sleep onset was noted on the previous night, with sleep onset at 2:30 AM and total sleep time 196.5 minutes. The patient took clonazepam 0.5 mg, a long half-life sedative, at 2:03AM. Sleep onset on each nap, with mean sleep latency less than 8 minutes, is abnormal, but clinical interpretation is advised because of the reduced night-time sleep and use of sedative medication. - Increased REM pressure was not noted during the MSLT, but was noted during the preceding NPSG, where it might possibly have reflected REM-rebound off antidepresants. If noted on at least 2 naps during an MSLT, REM can be  indicative of narcolepsy.   DIAGNOSIS - Pathologic Sleepiness - Idiopathic hypersomnia (327.11 [G47.11 ICD-10])  RECOMMENDATIONS - Manage for Obstructive Sleep Apnea and Excessive Daytime Somnolence based on clinical judgment.  [Electronically signed] 12/16/2018 01:01 PM  Baird Lyons MD, [place], American Board of Sleep Medicine   NPI: [id number]                          [place], [place] of [facility]  ELECTRONICALLY SIGNED ON:  12/16/2018, 12:47 PM [place] PH: [phone]   FX: [phone] Lyons

## 2019-01-04 ENCOUNTER — Other Ambulatory Visit: Payer: Self-pay | Admitting: Nephrology

## 2019-01-04 DIAGNOSIS — N183 Chronic kidney disease, stage 3 unspecified: Secondary | ICD-10-CM

## 2019-01-10 ENCOUNTER — Other Ambulatory Visit: Payer: Self-pay

## 2019-01-11 ENCOUNTER — Encounter: Payer: Self-pay | Admitting: Pulmonary Disease

## 2019-01-29 ENCOUNTER — Institutional Professional Consult (permissible substitution): Payer: Medicare Other | Admitting: Pulmonary Disease

## 2019-01-29 ENCOUNTER — Ambulatory Visit
Admission: RE | Admit: 2019-01-29 | Discharge: 2019-01-29 | Disposition: A | Discharge status: Home / Self Care | Payer: Medicare Other | Source: Ambulatory Visit | Attending: Nephrology | Admitting: Nephrology

## 2019-01-29 DIAGNOSIS — N183 Chronic kidney disease, stage 3 unspecified: Secondary | ICD-10-CM

## 2019-01-31 ENCOUNTER — Institutional Professional Consult (permissible substitution): Payer: Medicare Other | Admitting: Pulmonary Disease

## 2019-02-21 ENCOUNTER — Institutional Professional Consult (permissible substitution): Payer: Medicare Other | Admitting: Pulmonary Disease

## 2019-06-03 ENCOUNTER — Other Ambulatory Visit: Payer: Self-pay

## 2019-06-03 ENCOUNTER — Encounter: Payer: Self-pay | Admitting: Family Medicine

## 2019-06-03 ENCOUNTER — Ambulatory Visit (INDEPENDENT_AMBULATORY_CARE_PROVIDER_SITE_OTHER): Payer: Medicare Other | Admitting: Family Medicine

## 2019-06-03 VITALS — BP 106/70 | HR 88 | Temp 98.2°F | Resp 16 | Ht 62.0 in | Wt 175.6 lb

## 2019-06-03 DIAGNOSIS — M79604 Pain in right leg: Secondary | ICD-10-CM | POA: Diagnosis not present

## 2019-06-03 DIAGNOSIS — M545 Low back pain, unspecified: Secondary | ICD-10-CM

## 2019-06-03 MED ORDER — LIDOCAINE 5 % EX PTCH: 1.0000 | MEDICATED_PATCH | CUTANEOUS | 2 refills | Status: DC

## 2019-06-03 NOTE — Progress Notes (Signed)
Subjective:  Patient ID: Alicia Carlson, female    DOB: 02-Nov-1975  Age: 44 y.o. MRN: [id number]  Chief Complaint  Patient presents with  . Leg Pain    right    HPI Patient is a 44 year old white female who presents complaining of right leg pain.  It began last night.  It has improved with rest.  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   Social History   Socioeconomic History  . Marital status: Married    Spouse name: Not on file  . Number of children: Not on file  . Years of education: Not on file  . Highest education level: Not on file  Occupational History  . Not on file  Tobacco Use  . Smoking status: Never Smoker  . Smokeless tobacco: Never Used  Substance and Sexual Activity  . Alcohol use: Never  . Drug use: Never  . Sexual activity: Not on file  Other Topics Concern  . Not on file  Social History Narrative  . Not on file   Social Determinants of Health   Financial Resource Strain:   . Difficulty of Paying Living Expenses: Not on file  Food Insecurity:   . Worried About Programme researcher, broadcasting/film/video in the Last Year: Not on file  . Ran Out of Food in the Last Year: Not on file  Transportation Needs:   . Lack of Transportation (Medical): Not on file  . Lack of Transportation (Non-Medical): Not on file  Physical Activity:   . Days of Exercise per Week: Not on file  . Minutes of Exercise per Session: Not on file  Stress:   . Feeling of Stress : Not on file  Social Connections:   . Frequency of Communication with Friends and Family: Not on file  . Frequency of Social Gatherings with Friends and Family: Not on file  . Attends Religious Services: Not on file  . Active Member of Clubs or Organizations: Not on file  . Attends Banker Meetings: Not on file  . Marital Status: Not on file   Past Medical History:  Diagnosis Date  . Bipolar disorder with depression (HCC)    . Chronic renal failure, stage 3 (moderate) 2020  . Medical history non-contributory   . Migraine headache   . Panic attacks    Family History  Problem Relation Age of Onset  . Colon cancer Mother   . Depression Mother   . Schizophrenia Mother   . CVA Mother   . Seizures Sister     Review of Systems  Constitutional: Negative for chills, fatigue and fever.  HENT: Negative for congestion, ear pain and sore throat.   Respiratory: Negative for cough and shortness of breath.   Cardiovascular: Negative for chest pain.  Musculoskeletal: Positive for back pain.       Patient sees chiropractor once a month. She is requesting a lidocaine patch 5%. Her friend has one and thinks it may help.      Objective:  BP 106/70 (BP Location: Left Arm, Patient Position: Sitting, Cuff Size: Normal)   Pulse 88   Temp 98.2 F (36.8 C)   Resp 16   Ht 5\' 2"  (1.575 m)   Wt 175 lb 9.6 oz (79.7 kg)   BMI 32.12 kg/m   BP/Weight 06/03/2019 12/12/2018 12/11/2018  Systolic BP 106 - -  Diastolic BP 70 - -  Wt. (Lbs) 175.6  170 170  BMI 32.12 31.09 31.09  Some encounter information is confidential and restricted. Go to Review Flowsheets activity to see all data.    Physical Exam Constitutional:      Appearance: Normal appearance.  Cardiovascular:     Rate and Rhythm: Normal rate and regular rhythm.     Heart sounds: Normal heart sounds.  Pulmonary:     Effort: Pulmonary effort is normal. No respiratory distress.     Breath sounds: Normal breath sounds.  Musculoskeletal:        General: No swelling, tenderness or signs of injury.     Right lower leg: No edema.     Left lower leg: No edema.  Neurological:     Mental Status: She is alert.     Lab Results  Component Value Date   WBC 10.6 (H) 01/30/2018   HGB 13.3 01/30/2018   HCT 40.9 01/30/2018   PLT 256 01/30/2018   GLUCOSE 104 (H) 01/30/2018   CHOL 291 (H) 01/30/2018   TRIG 191 (H) 01/30/2018   HDL 52 01/30/2018   LDLCALC 201 (H)  01/30/2018   ALT 29 01/30/2018   AST 23 01/30/2018   NA 137 01/30/2018   K 3.5 01/30/2018   CL 104 01/30/2018   CREATININE 1.37 (H) 01/30/2018   BUN 16 01/30/2018   CO2 24 01/30/2018   TSH 4.468 01/30/2018   HGBA1C 5.2 01/30/2018      Assessment & Plan:   No problem-specific Assessment & Plan notes found for this encounter.   No orders of the defined types were placed in this encounter.      Follow-up: No follow-ups on file.  AVS was given to patient prior to departure.  [name] [phone]

## 2019-06-13 DIAGNOSIS — M79604 Pain in right leg: Secondary | ICD-10-CM | POA: Insufficient documentation

## 2019-06-13 DIAGNOSIS — M545 Low back pain, unspecified: Secondary | ICD-10-CM | POA: Insufficient documentation

## 2019-06-13 NOTE — Assessment & Plan Note (Signed)
Recommend compression stockings

## 2019-06-13 NOTE — Assessment & Plan Note (Signed)
Rx: Lidocaine patches.

## 2019-06-20 ENCOUNTER — Telehealth: Payer: Self-pay

## 2019-06-20 NOTE — Telephone Encounter (Signed)
PA submitted and denied via covermymeds for Lidocaine patches. Unknown what the patients insurance plan prefers. Will possibly be listed on documents from insurance company when they fax the denial notice.

## 2019-07-05 ENCOUNTER — Ambulatory Visit: Payer: Medicare Other | Admitting: Nurse Practitioner

## 2019-07-15 ENCOUNTER — Ambulatory Visit: Payer: Medicare Other | Admitting: Family Medicine

## 2019-07-23 NOTE — Progress Notes (Signed)
Patient cancelled her appt.

## 2019-07-24 ENCOUNTER — Ambulatory Visit (INDEPENDENT_AMBULATORY_CARE_PROVIDER_SITE_OTHER): Payer: Medicare Other | Admitting: Family Medicine

## 2019-07-31 NOTE — Progress Notes (Signed)
Established Patient Office Visit  Subjective:  Patient ID: Alicia Carlson, female    DOB: [birth year]  Age: 44 y.o. MRN: [id number]  CC:  Chief Complaint  Patient presents with  . Leg Pain    Bursitis    HPI [facility] presents for [employer]. Sleep apnea was diagnosed, but patient does not use cpap. Was told was mild and that weight loss would help. She tries to eat healthy. Not exercising.  Looking back the patient had her cholesterol drawn in 2019 and it was very high.  The patient does not recall being even told that she had high cholesterol.  She also had an A1c drawn which was normal at that time.  Right bursitis has improved. She saw her chiropractor which helped   Past Medical History:  Diagnosis Date  . Bipolar disorder with depression (HCC)   . Chronic renal failure, stage 3 (moderate) 2020  . Medical history non-contributory   . Migraine headache   . Panic attacks     Past Surgical History:  Procedure Laterality Date  . biopsy of left breast    . CESAREAN SECTION    . HERNIA REPAIR     5 weeks    Family History  Problem Relation Age of Onset  . Colon cancer Mother   . Depression Mother   . Schizophrenia Mother   . CVA Mother   . Seizures Sister     Social History   Socioeconomic History  . Marital status: Married    Spouse name: Not on file  . Number of children: Not on file  . Years of education: Not on file  . Highest education level: Not on file  Occupational History  . Not on file  Tobacco Use  . Smoking status: Never Smoker  . Smokeless tobacco: Never Used  Substance and Sexual Activity  . Alcohol use: Never  . Drug use: Never  . Sexual activity: Not on file  Other Topics Concern  . Not on file  Social History Narrative  . Not on file   Social Determinants of Health   Financial Resource Strain:   . Difficulty of Paying Living Expenses:   Food Insecurity:   . Worried About Programme researcher, broadcasting/film/video in the Last Year:   . Engineer, site in the Last Year:   Transportation Needs:   . Freight forwarder (Medical):   Marland Kitchen Lack of Transportation (Non-Medical):   Physical Activity:   . Days of Exercise per Week:   . Minutes of Exercise per Session:   Stress:   . Feeling of Stress :   Social Connections:   . Frequency of Communication with Friends and Family:   . Frequency of Social Gatherings with Friends and Family:   . Attends Religious Services:   . Active Member of Clubs or Organizations:   . Attends Banker Meetings:   Marland Kitchen Marital Status:   Intimate Partner Violence:   . Fear of Current or Ex-Partner:   . Emotionally Abused:   Marland Kitchen Physically Abused:   . Sexually Abused:     Outpatient Medications Prior to Visit  Medication Sig Dispense Refill  . amphetamine-dextroamphetamine (ADDERALL XR) 15 MG 24 hr capsule Take by mouth every morning.    . Cholecalciferol 25 MCG (1000 UT) CHEW Chew by mouth.    . clonazePAM (KLONOPIN) 0.5 MG tablet Take 0.25-0.5 mg by mouth 2 (two) times daily as needed.    . DULoxetine (CYMBALTA) 60 MG  capsule Take 120 mg by mouth daily.    Marland Kitchen lamoTRIgine (LAMICTAL) 150 MG tablet Take 150 mg by mouth daily.    Marland Kitchen lidocaine (LIDODERM) 5 % Place 1 patch onto the skin daily. Remove & Discard patch within 12 hours or as directed by MD 30 patch 2  . topiramate (TOPAMAX) 100 MG tablet      No facility-administered medications prior to visit.    No Known Allergies  ROS Review of Systems  Constitutional: Negative for chills, fatigue and fever.  HENT: Negative for congestion, ear pain, rhinorrhea and sore throat.   Respiratory: Negative for cough and shortness of breath.   Cardiovascular: Negative for chest pain.  Gastrointestinal: Negative for abdominal pain, constipation, diarrhea, nausea and vomiting.  Genitourinary: Negative for dysuria and urgency.  Musculoskeletal: Positive for back pain. Negative for myalgias.       Sees chiropractor. Helps   Neurological: Negative for  dizziness, weakness, light-headedness and headaches.  Psychiatric/Behavioral: Negative for dysphoric mood. The patient is not nervous/anxious.       Objective:    Physical Exam  Constitutional: She is oriented to person, place, and time. She appears well-developed and well-nourished.  Cardiovascular: Normal rate, regular rhythm and normal heart sounds.  Pulmonary/Chest: Effort normal and breath sounds normal.  Abdominal: Soft. Bowel sounds are normal. There is abdominal tenderness.  Neurological: She is alert and oriented to person, place, and time.  Psychiatric: She has a normal mood and affect. Her behavior is normal.    BP 110/60   Pulse 100   Temp 97.7 F (36.5 C)   Resp 16   Ht 5\' 2"  (1.575 m)   Wt 172 lb (78 kg)   BMI 31.46 kg/m  Wt Readings from Last 3 Encounters:  08/01/19 172 lb (78 kg)  06/03/19 175 lb 9.6 oz (79.7 kg)  12/12/18 170 lb (77.1 kg)     Health Maintenance Due  Topic Date Due  . HIV Screening  Never done  . COVID-19 Vaccine (1) Never done  . TETANUS/TDAP  Never done  . PAP SMEAR-Modifier  Never done    There are no preventive care reminders to display for this patient.  Lab Results  Component Value Date   TSH 4.468 01/30/2018   Lab Results  Component Value Date   WBC 6.4 08/02/2019   HGB 13.5 08/02/2019   HCT 39.9 08/02/2019   MCV 90 08/02/2019   PLT 208 08/02/2019   Lab Results  Component Value Date   NA 140 08/02/2019   K 3.9 08/02/2019   CO2 22 08/02/2019   GLUCOSE 99 08/02/2019   BUN 11 08/02/2019   CREATININE 1.15 (H) 08/02/2019   BILITOT 0.2 08/02/2019   ALKPHOS 86 08/02/2019   AST 12 08/02/2019   ALT 19 08/02/2019   PROT 6.3 08/02/2019   ALBUMIN 4.2 08/02/2019   CALCIUM 8.5 (L) 08/02/2019   ANIONGAP 9 01/30/2018   Lab Results  Component Value Date   CHOL 177 08/02/2019   Lab Results  Component Value Date   HDL 40 08/02/2019   Lab Results  Component Value Date   LDLCALC 116 (H) 08/02/2019   Lab Results   Component Value Date   TRIG 113 08/02/2019   Lab Results  Component Value Date   CHOLHDL 4.4 08/02/2019   Lab Results  Component Value Date   HGBA1C 5.3 08/02/2019      Assessment & Plan:  1. Mixed hyperlipidemia Well controlled with diet and exercise.  Labs drawn today. - Lipid panel; Future  2. Elevated fasting blood sugar Low sugar diet - Hemoglobin A1c; Future  3. CKD (chronic kidney disease) stage 2, GFR 60-89 ml/min This is also noted on her labs from 2019.  We will recheck today. - CBC with Differential/Platelet; Future - Comprehensive metabolic panel; Future  4. Class 1 obesity due to excess calories with serious comorbidity and body mass index (BMI) of 31.0 to 31.9 in adult Start patient on phentermine 37.5 mg once daily. Recommend work on diet and exercise.    Follow-up: Return in about 2 months (around 10/01/2019) for Weight management follow-up.  Nurse visit in am fasting. Rochel Brome, MD

## 2019-08-01 ENCOUNTER — Other Ambulatory Visit: Payer: Self-pay

## 2019-08-01 ENCOUNTER — Ambulatory Visit (INDEPENDENT_AMBULATORY_CARE_PROVIDER_SITE_OTHER): Payer: Medicare Other | Admitting: Family Medicine

## 2019-08-01 VITALS — BP 110/60 | HR 100 | Temp 97.7°F | Resp 16 | Ht 62.0 in | Wt 172.0 lb

## 2019-08-01 DIAGNOSIS — E782 Mixed hyperlipidemia: Secondary | ICD-10-CM | POA: Diagnosis not present

## 2019-08-01 DIAGNOSIS — N182 Chronic kidney disease, stage 2 (mild): Secondary | ICD-10-CM

## 2019-08-01 DIAGNOSIS — E6609 Other obesity due to excess calories: Secondary | ICD-10-CM

## 2019-08-01 DIAGNOSIS — R7301 Impaired fasting glucose: Secondary | ICD-10-CM | POA: Insufficient documentation

## 2019-08-01 DIAGNOSIS — Z6831 Body mass index (BMI) 31.0-31.9, adult: Secondary | ICD-10-CM

## 2019-08-01 MED ORDER — PHENTERMINE HCL 37.5 MG PO CAPS: 37.5000 mg | ORAL_CAPSULE | ORAL | 1 refills | Status: DC

## 2019-08-01 NOTE — Patient Instructions (Addendum)
Phentermine 37.5 mg once daily in am.   Exercise Exercising regularly is important for your overall health. Exercising is not only about losing weight. It has many other health benefits, such as increasing muscle strength and bone density and reducing body fat and stress. This leads to improved fitness, flexibility, and endurance, all of which result in better overall health. Exercise has additional benefits for people with diabetes, including:  Reducing appetite.  Helping to lower and control blood glucose.  Lowering blood pressure.  Helping to control amounts of fatty substances (lipids) in the blood, such as cholesterol and triglycerides.  Helping the body to respond better to insulin (improving insulin sensitivity).  Reducing how much insulin the body needs.  Decreasing the risk for heart disease by: ? Lowering cholesterol and triglyceride levels. ? Increasing the levels of good cholesterol. ? Lowering blood glucose levels. What is my activity plan? Your health care provider or certified diabetes educator can help you make a plan for the type and frequency of exercise (activity plan) that works for you. Make sure that you:  Do at least 150 minutes of moderate-intensity or vigorous-intensity exercise each week. This could be brisk walking, biking, or water aerobics. ? Do stretching and strength exercises, such as yoga or weightlifting, at least 2 times a week. ? Spread out your activity over at least 3 days of the week.  Get some form of physical activity every day. ? Do not go more than 2 days in a row without some kind of physical activity. ? Avoid being inactive for more than 30 minutes at a time. Take frequent breaks to walk or stretch.  Choose a type of exercise or activity that you enjoy, and set realistic goals.  Start slowly, and gradually increase the intensity of your exercise over time.  Healthy Eating Following a healthy eating pattern may help you to achieve and  maintain a healthy body weight, reduce the risk of chronic disease, and live a long and productive life. It is important to follow a healthy eating pattern at an appropriate calorie level for your body. Your nutritional needs should be met primarily through food by choosing a variety of nutrient-rich foods. What are tips for following this plan? Reading food labels  Read labels and choose the following: ? Reduced or low sodium. ? Juices with 100% fruit juice. ? Foods with low saturated fats and high polyunsaturated and monounsaturated fats. ? Foods with whole grains, such as whole wheat, cracked wheat, brown rice, and wild rice. ? Whole grains that are fortified with folic acid. This is recommended for women who are pregnant or who want to become pregnant.  Read labels and avoid the following: ? Foods with a lot of added sugars. These include foods that contain brown sugar, corn sweetener, corn syrup, dextrose, fructose, glucose, high-fructose corn syrup, honey, invert sugar, lactose, malt syrup, maltose, molasses, raw sugar, sucrose, trehalose, or turbinado sugar.  Do not eat more than the following amounts of added sugar per day:  6 teaspoons (25 g) for women.  9 teaspoons (38 g) for men. ? Foods that contain processed or refined starches and grains. ? Refined grain products, such as white flour, degermed cornmeal, white bread, and white rice. Shopping  Choose nutrient-rich snacks, such as vegetables, whole fruits, and nuts. Avoid high-calorie and high-sugar snacks, such as potato chips, fruit snacks, and candy.  Use oil-based dressings and spreads on foods instead of solid fats such as butter, stick margarine, or cream cheese.  Limit pre-made sauces, mixes, and "instant" products such as flavored rice, instant noodles, and ready-made pasta.  Try more plant-protein sources, such as tofu, tempeh, black beans, edamame, lentils, nuts, and seeds.  Explore eating plans such as the  Mediterranean diet or vegetarian diet. Cooking  Use oil to saut or stir-fry foods instead of solid fats such as butter, stick margarine, or lard.  Try baking, boiling, grilling, or broiling instead of frying.  Remove the fatty part of meats before cooking.  Steam vegetables in water or broth. Meal planning   At meals, imagine dividing your plate into fourths: ? One-half of your plate is fruits and vegetables. ? One-fourth of your plate is whole grains. ? One-fourth of your plate is protein, especially lean meats, poultry, eggs, tofu, beans, or nuts.  Include low-fat dairy as part of your daily diet. Lifestyle  Choose healthy options in all settings, including home, work, school, restaurants, or stores.  Prepare your food safely: ? Wash your hands after handling raw meats. ? Keep food preparation surfaces clean by regularly washing with hot, soapy water. ? Keep raw meats separate from ready-to-eat foods, such as fruits and vegetables. ? Cook seafood, meat, poultry, and eggs to the recommended internal temperature. ? Store foods at safe temperatures. In general:  Keep cold foods at 40F (4.4C) or below.  Keep hot foods at 140F (60C) or above.  Keep your freezer at [facility] (-17.8C) or below.  Foods are no longer safe to eat when they have been between the temperatures of 40-140F (4.4-60C) for more than 2 hours. What foods should I eat? Fruits Aim to eat 2 cup-equivalents of fresh, canned (in natural juice), or frozen fruits each day. Examples of 1 cup-equivalent of fruit include 1 small apple, 8 large strawberries, 1 cup canned fruit,  cup dried fruit, or 1 cup 100% juice. Vegetables Aim to eat 2-3 cup-equivalents of fresh and frozen vegetables each day, including different varieties and colors. Examples of 1 cup-equivalent of vegetables include 2 medium carrots, 2 cups raw, leafy greens, 1 cup chopped vegetable (raw or cooked), or 1 medium baked potato. Grains Aim to  eat 6 ounce-equivalents of whole grains each day. Examples of 1 ounce-equivalent of grains include 1 slice of bread, 1 cup ready-to-eat cereal, 3 cups popcorn, or  cup cooked rice, pasta, or cereal. Meats and other proteins Aim to eat 5-6 ounce-equivalents of protein each day. Examples of 1 ounce-equivalent of protein include 1 egg, 1/2 cup nuts or seeds, or 1 tablespoon (16 g) peanut butter. A cut of meat or fish that is the size of a deck of cards is about 3-4 ounce-equivalents.  Of the protein you eat each week, try to have at least 8 ounces come from seafood. This includes salmon, trout, herring, and anchovies. Dairy Aim to eat 3 cup-equivalents of fat-free or low-fat dairy each day. Examples of 1 cup-equivalent of dairy include 1 cup (240 mL) milk, 8 ounces (250 g) yogurt, 1 ounces (44 g) natural cheese, or 1 cup (240 mL) fortified soy milk. Fats and oils  Aim for about 5 teaspoons (21 g) per day. Choose monounsaturated fats, such as canola and olive oils, avocados, peanut butter, and most nuts, or polyunsaturated fats, such as sunflower, corn, and soybean oils, walnuts, pine nuts, sesame seeds, sunflower seeds, and flaxseed. Beverages  Aim for six 8-oz glasses of water per day. Limit coffee to three to five 8-oz cups per day.  Limit caffeinated beverages that have added calories, such  as soda and energy drinks.  Limit alcohol intake to no more than 1 drink a day for nonpregnant women and 2 drinks a day for men. One drink equals 12 oz of beer (355 mL), 5 oz of wine (148 mL), or 1 oz of hard liquor (44 mL). Seasoning and other foods  Avoid adding excess amounts of salt to your foods. Try flavoring foods with herbs and spices instead of salt.  Avoid adding sugar to foods.  Try using oil-based dressings, sauces, and spreads instead of solid fats. This information is based on general [place] nutrition guidelines. For more information, visit [employer]. Exact amounts may vary based on  your nutrition needs. Summary  A healthy eating plan may help you to maintain a healthy weight, reduce the risk of chronic diseases, and stay active throughout your life.  Plan your meals. Make sure you eat the right portions of a variety of nutrient-rich foods.  Try baking, boiling, grilling, or broiling instead of frying.  Choose healthy options in all settings, including home, work, school, restaurants, or stores. This information is not intended to replace advice given to you by your health care provider. Make sure you discuss any questions you have with your health care provider. Document Revised: 07/10/2017 Document Reviewed: 07/10/2017 Elsevier Patient Education  2020 [employer].   Lipid Profile Test Why am I having this test? The lipid profile test can be used to help evaluate your risk for developing heart disease. The test is also used to monitor your levels during treatment for high cholesterol to see if you are reaching your goals. What is being tested? A lipid profile measures the following:  Total cholesterol. Cholesterol is a waxy, fat-like substance in your blood. If your total cholesterol level is high, this can increase your risk for heart disease.  High-density lipoprotein (HDL). This is known as the good cholesterol. Having high levels of HDL decreases your risk for heart disease. Your HDL level may be low if you smoke or do not get enough exercise.  Low-density lipoprotein (LDL). This is known as the bad cholesterol. This type causes plaque to build up in your arteries. Having a low level of LDL is best. Having high levels of LDL increases your risk for heart disease.  Cholesterol to HDL ratio. This is calculated by dividing your total cholesterol by your HDL cholesterol. The ratio is used by health care providers to determine your risk for heart disease. A low ratio is best.  Triglycerides. These are fats that your body can store or burn for energy. Low levels  are best. Having high levels of triglycerides increases your risk for heart disease. What kind of sample is taken?  A blood sample is required for this test. It is usually collected by inserting a needle into a blood vessel. How do I prepare for this test? Do not drink alcohol starting at least 24 hours before your test. Follow any instructions from your health care provider about dietary restrictions before your test. Do not eat or drink anything other than water after midnight on the night before the test, or as told by your health care provider. Tell a health care provider about:  All medicines you are taking, including vitamins, herbs, eye drops, creams, and over-the-counter medicines.  Any medical conditions you have.  Whether you are pregnant or may be pregnant. How are the results reported? Your test results will be reported as values that indicate your cholesterol and triglyceride levels. Your health care provider  will compare your results to normal ranges that were established after testing a large group of people (reference ranges). Reference ranges may vary among labs and hospitals. For this test, common reference ranges are: Total cholesterol  Adult or elderly: less than 200 mg/dL.  Child: 120-200 mg/dL.  Infant: 70-175 mg/dL.  Newborn: 53-135 mg/dL. HDL  Female: greater than 45 mg/dL.  Female: greater than 55 mg/dL. HDL reference values based on your risk for heart disease:  Low risk for heart disease: ? Female: 60 mg/dL. ? Female: 70 mg/dL.  Moderate risk for heart disease: ? Female: 45 mg/dL. ? Female: 55 mg/dL.  High risk for heart disease: ? Female: 25 mg/dL. ? Female: 35 mg/dL. LDL  Adults: Your health care provider will determine a target level for LDL based on your risk for heart disease. ? If you are at low risk, your LDL should be 130 mg/dL or less. ? If you are at moderate risk, your LDL should be 100 mg/dL or less. ? If you are at high risk, your LDL  should be 70 mg/dL or less.  Children: less than 110 mg/dL. Cholesterol to HDL ratio Reference values based on your risk for heart disease:  Risk that is half the average risk: ? Female: 3.4. ? Female: 3.3.  Average risk: ? Female: 5.0. ? Female: 4.4.  Risk that is two times average (moderate risk): ? Female: 10.0. ? Female: 7.0.  Risk that is three times average (high risk): ? Female: 24.0. ? Female: 11.0. Triglycerides  Adult or elderly: ? Female: 40-160 mg/dL. ? Female: 35-135 mg/dL.  Children 29-55 years old: ? Female: 40-163 mg/dL. ? Female: 40-128 mg/dL.  Children 66-59 years old: ? Female: 36-138 mg/dL. ? Female: 41-138 mg/dL.  Children 53-50 years old: ? Female: 31-108 mg/dL. ? Female: 35-114 mg/dL.  Children 57-51 years old: ? Female: 30-86 mg/dL. ? Female: 32-99 mg/dL. Triglycerides should be less than 400 mg/dL even when you are not fasting. What do the results mean? Results that are within the reference ranges are considered normal. Total cholesterol, LDL, and triglyceride levels that are higher than the reference ranges can mean that you have an increased risk for heart disease. An HDL level that is lower than the reference range can also indicate an increased risk. Talk with your health care provider about what your results mean. Questions to ask your health care provider Ask your health care provider, or the department that is doing the test:  When will my results be ready?  How will I get my results?  What are my treatment options?  What other tests do I need?  What are my next steps? Summary  The lipid profile test can be used to help predict the likelihood that you will develop heart disease. It can also help monitor your cholesterol levels during treatment.  A lipid profile measures your total cholesterol, high-density lipoprotein (HDL), low-density lipoprotein (LDL), cholesterol to HDL ratio, and triglycerides.  Total cholesterol, LDL, and triglyceride  levels that are higher than the reference ranges can indicate an increased risk for heart disease.  An HDL level that is lower than the reference range can indicate an increased risk for heart disease.  Talk with your health care provider about what your results mean. This information is not intended to replace advice given to you by your health care provider. Make sure you discuss any questions you have with your health care provider. Document Revised: 01/02/2017 Document Reviewed: 01/02/2017 Elsevier Patient Education  [place].

## 2019-08-02 ENCOUNTER — Other Ambulatory Visit: Payer: Medicare Other

## 2019-08-02 DIAGNOSIS — R7301 Impaired fasting glucose: Secondary | ICD-10-CM

## 2019-08-02 DIAGNOSIS — N182 Chronic kidney disease, stage 2 (mild): Secondary | ICD-10-CM

## 2019-08-02 DIAGNOSIS — E782 Mixed hyperlipidemia: Secondary | ICD-10-CM

## 2019-08-03 LAB — HEMOGLOBIN A1C
Est. average glucose Bld gHb Est-mCnc: 105 mg/dL
Hgb A1c MFr Bld: 5.3 % (ref 4.8–5.6)

## 2019-08-03 LAB — COMPREHENSIVE METABOLIC PANEL
ALT: 19 IU/L (ref 0–32)
AST: 12 IU/L (ref 0–40)
Albumin/Globulin Ratio: 2 (ref 1.2–2.2)
Albumin: 4.2 g/dL (ref 3.8–4.8)
Alkaline Phosphatase: 86 IU/L (ref 39–117)
BUN/Creatinine Ratio: 10 (ref 9–23)
BUN: 11 mg/dL (ref 6–24)
Bilirubin Total: 0.2 mg/dL (ref 0.0–1.2)
CO2: 22 mmol/L (ref 20–29)
Calcium: 8.5 mg/dL — ABNORMAL LOW (ref 8.7–10.2)
Chloride: 103 mmol/L (ref 96–106)
Creatinine, Ser: 1.15 mg/dL — ABNORMAL HIGH (ref 0.57–1.00)
GFR calc Af Amer: 66 mL/min/{1.73_m2} (ref >59)
GFR calc non Af Amer: 57 mL/min/{1.73_m2} — ABNORMAL LOW (ref >59)
Globulin, Total: 2.1 g/dL (ref 1.5–4.5)
Glucose: 99 mg/dL (ref 65–99)
Potassium: 3.9 mmol/L (ref 3.5–5.2)
Sodium: 140 mmol/L (ref 134–144)
Total Protein: 6.3 g/dL (ref 6.0–8.5)

## 2019-08-03 LAB — LIPID PANEL
Chol/HDL Ratio: 4.4 ratio (ref 0.0–4.4)
Cholesterol, Total: 177 mg/dL (ref 100–199)
HDL: 40 mg/dL (ref >39)
LDL Chol Calc (NIH): 116 mg/dL — ABNORMAL HIGH (ref 0–99)
Triglycerides: 113 mg/dL (ref 0–149)
VLDL Cholesterol Cal: 21 mg/dL (ref 5–40)

## 2019-08-03 LAB — CBC WITH DIFFERENTIAL/PLATELET
Basophils Absolute: 0 10*3/uL (ref 0.0–0.2)
Basos: 0 %
EOS (ABSOLUTE): 0.1 10*3/uL (ref 0.0–0.4)
Eos: 2 %
Hematocrit: 39.9 % (ref 34.0–46.6)
Hemoglobin: 13.5 g/dL (ref 11.1–15.9)
Immature Grans (Abs): 0 10*3/uL (ref 0.0–0.1)
Immature Granulocytes: 0 %
Lymphocytes Absolute: 2 10*3/uL (ref 0.7–3.1)
Lymphs: 31 %
MCH: 30.3 pg (ref 26.6–33.0)
MCHC: 33.8 g/dL (ref 31.5–35.7)
MCV: 90 fL (ref 79–97)
Monocytes Absolute: 0.6 10*3/uL (ref 0.1–0.9)
Monocytes: 9 %
Neutrophils Absolute: 3.7 10*3/uL (ref 1.4–7.0)
Neutrophils: 58 %
Platelets: 208 10*3/uL (ref 150–450)
RBC: 4.46 x10E6/uL (ref 3.77–5.28)
RDW: 13 % (ref 11.7–15.4)
WBC: 6.4 10*3/uL (ref 3.4–10.8)

## 2019-08-03 LAB — CARDIOVASCULAR RISK ASSESSMENT

## 2019-08-05 ENCOUNTER — Encounter: Payer: Self-pay | Admitting: Family Medicine

## 2019-08-22 ENCOUNTER — Encounter: Payer: Self-pay | Admitting: Family Medicine

## 2019-08-22 ENCOUNTER — Other Ambulatory Visit: Payer: Self-pay

## 2019-08-22 ENCOUNTER — Ambulatory Visit (INDEPENDENT_AMBULATORY_CARE_PROVIDER_SITE_OTHER): Payer: Medicare Other | Admitting: Family Medicine

## 2019-08-22 VITALS — BP 124/68 | HR 99 | Temp 97.3°F | Ht 62.0 in | Wt 171.0 lb

## 2019-08-22 DIAGNOSIS — M722 Plantar fascial fibromatosis: Secondary | ICD-10-CM

## 2019-08-22 NOTE — Patient Instructions (Signed)
Plantar fasciitis  Plantar Fasciitis  Plantar fasciitis is a painful foot condition that affects the heel. It occurs when the band of tissue that connects the toes to the heel bone (plantar fascia) becomes irritated. This can happen as the result of exercising too much or doing other repetitive activities (overuse injury). The pain from plantar fasciitis can range from mild irritation to severe pain that makes it difficult to walk or move. The pain is usually worse in the morning after sleeping, or after sitting or lying down for a while. Pain may also be worse after long periods of walking or standing. What are the causes? This condition may be caused by:  Standing for long periods of time.  Wearing shoes that do not have good arch support.  Doing activities that put stress on joints (high-impact activities), including running, aerobics, and ballet.  Being overweight.  An abnormal way of walking (gait).  Tight muscles in the back of your lower leg (calf).  High arches in your feet.  Starting a new athletic activity. What are the signs or symptoms? The main symptom of this condition is heel pain. Pain may:  Be worse with first steps after a time of rest, especially in the morning after sleeping or after you have been sitting or lying down for a while.  Be worse after long periods of standing still.  Decrease after 30-45 minutes of activity, such as gentle walking. How is this diagnosed? This condition may be diagnosed based on your medical history and your symptoms. Your health care provider may ask questions about your activity level. Your health care provider will do a physical exam to check for:  A tender area on the bottom of your foot.  A high arch in your foot.  Pain when you move your foot.  Difficulty moving your foot. You may have imaging tests to confirm the diagnosis, such as:  X-rays.  Ultrasound.  MRI. How is this treated? Treatment for plantar fasciitis  depends on how severe your condition is. Treatment may include:  Rest, ice, applying pressure (compression), and raising the affected foot (elevation). This may be called RICE therapy. Your health care provider may recommend RICE therapy along with over-the-counter pain medicines to manage your pain.  Exercises to stretch your calves and your plantar fascia.  A splint that holds your foot in a stretched, upward position while you sleep (night splint).  Physical therapy to relieve symptoms and prevent problems in the future.  Injections of steroid medicine (cortisone) to relieve pain and inflammation.  Stimulating your plantar fascia with electrical impulses (extracorporeal shock wave therapy). This is usually the last treatment option before surgery.  Surgery, if other treatments have not worked after 12 months. Follow these instructions at home:  Managing pain, stiffness, and swelling  If directed, put ice on the painful area: ? Put ice in a plastic bag, or use a frozen bottle of water. ? Place a towel between your skin and the bag or bottle. ? Roll the bottom of your foot over the bag or bottle. ? Do this for 20 minutes, 2-3 times a day.  Wear athletic shoes that have air-sole or gel-sole cushions, or try wearing soft shoe inserts that are designed for plantar fasciitis.  Raise (elevate) your foot above the level of your heart while you are sitting or lying down. Activity  Avoid activities that cause pain. Ask your health care provider what activities are safe for you.  Do physical therapy exercises and  stretches as told by your health care provider.  Try activities and forms of exercise that are easier on your joints (low-impact). Examples include swimming, water aerobics, and biking. General instructions  Take over-the-counter and prescription medicines only as told by your health care provider.  Wear a night splint while sleeping, if told by your health care provider.  Loosen the splint if your toes tingle, become numb, or turn cold and blue.  Maintain a healthy weight, or work with your health care provider to lose weight as needed.  Keep all follow-up visits as told by your health care provider. This is important. Contact a health care provider if you:  Have symptoms that do not go away after caring for yourself at home.  Have pain that gets worse.  Have pain that affects your ability to move or do your daily activities. Summary  Plantar fasciitis is a painful foot condition that affects the heel. It occurs when the band of tissue that connects the toes to the heel bone (plantar fascia) becomes irritated.  The main symptom of this condition is heel pain that may be worse after exercising too much or standing still for a long time.  Treatment varies, but it usually starts with rest, ice, compression, and elevation (RICE therapy) and over-the-counter medicines to manage pain. This information is not intended to replace advice given to you by your health care provider. Make sure you discuss any questions you have with your health care provider. Document Revised: 03/10/2017 Document Reviewed: 01/23/2017 Elsevier Patient Education  [place].   Plantar Fasciitis Rehab Ask your health care provider which exercises are safe for you. Do exercises exactly as told by your health care provider and adjust them as directed. It is normal to feel mild stretching, pulling, tightness, or discomfort as you do these exercises. Stop right away if you feel sudden pain or your pain gets worse. Do not begin these exercises until told by your health care provider. Stretching and range-of-motion exercises These exercises warm up your muscles and joints and improve the movement and flexibility of your foot. These exercises also help to relieve pain. Plantar fascia stretch  1. Sit with your left / right leg crossed over your opposite knee. 2. Hold your heel with one  hand with that thumb near your arch. With your other hand, hold your toes and gently pull them back toward the top of your foot. You should feel a stretch on the bottom of your toes or your foot (plantar fascia) or both. 3. Hold this stretch for__________ seconds. 4. Slowly release your toes and return to the starting position. Repeat __________ times. Complete this exercise __________ times a day. Gastrocnemius stretch, standing This exercise is also called a calf (gastroc) stretch. It stretches the muscles in the back of the upper calf. 1. Stand with your hands against a wall. 2. Extend your left / right leg behind you, and bend your front knee slightly. 3. Keeping your heels on the floor and your back knee straight, shift your weight toward the wall. Do not arch your back. You should feel a gentle stretch in your upper left / right calf. 4. Hold this position for __________ seconds. Repeat __________ times. Complete this exercise __________ times a day. Soleus stretch, standing This exercise is also called a calf (soleus) stretch. It stretches the muscles in the back of the lower calf. 1. Stand with your hands against a wall. 2. Extend your left / right leg behind you,  and bend your front knee slightly. 3. Keeping your heels on the floor, bend your back knee and shift your weight slightly over your back leg. You should feel a gentle stretch deep in your lower calf. 4. Hold this position for __________ seconds. Repeat __________ times. Complete this exercise __________ times a day. Gastroc and soleus stretch, standing step This exercise stretches the muscles in the back of the lower leg. These muscles are in the upper calf (gastrocnemius) and the lower calf (soleus). 1. Stand with the ball of your left / right foot on a step. The ball of your foot is on the walking surface, right under your toes. 2. Keep your other foot firmly on the same step. 3. Hold on to the wall or a railing for  balance. 4. Slowly lift your other foot, allowing your body weight to press your left / right heel down over the edge of the step. You should feel a stretch in your left / right calf. 5. Hold this position for __________ seconds. 6. Return both feet to the step. 7. Repeat this exercise with a slight bend in your left / right knee. Repeat __________ times with your left / right knee straight and __________ times with your left / right knee bent. Complete this exercise __________ times a day. Balance exercise This exercise builds your balance and strength control of your arch to help take pressure off your plantar fascia. Single leg stand If this exercise is too easy, you can try it with your eyes closed or while standing on a pillow. 1. Without shoes, stand near a railing or in a doorway. You may hold on to the railing or door frame as needed. 2. Stand on your left / right foot. Keep your big toe down on the floor and try to keep your arch lifted. Do not let your foot roll inward. 3. Hold this position for __________ seconds. Repeat __________ times. Complete this exercise __________ times a day. This information is not intended to replace advice given to you by your health care provider. Make sure you discuss any questions you have with your health care provider. Document Revised: 07/19/2018 Document Reviewed: 01/24/2018 Elsevier Patient Education  2020 [employer].

## 2019-08-22 NOTE — Progress Notes (Signed)
Acute Office Visit  Subjective:    Patient ID: Alicia Carlson, female    DOB: [birth year], 44 y.o.   MRN: [id number]  Chief Complaint  Patient presents with  . Foot Pain    Rt foot pain. Patient states she wants a rx for prednisone    HPI Patient is in today for right foot pain. She works at [employer] and is on hard floors all day. She has had this issue in the past, but it has worsened. She has CKD 3 so is unable to take nsaids. She is taking tylenol.  Past Medical History:  Diagnosis Date  . Bipolar disorder with depression (HCC)   . Chronic renal failure, stage 3 (moderate) 2020  . Medical history non-contributory   . Migraine headache   . Panic attacks     Past Surgical History:  Procedure Laterality Date  . biopsy of left breast    . CESAREAN SECTION    . HERNIA REPAIR     5 weeks    Family History  Problem Relation Age of Onset  . Colon cancer Mother   . Depression Mother   . Schizophrenia Mother   . CVA Mother   . Seizures Sister     Social History   Socioeconomic History  . Marital status: Married    Spouse name: Not on file  . Number of children: Not on file  . Years of education: Not on file  . Highest education level: Not on file  Occupational History  . Not on file  Tobacco Use  . Smoking status: Never Smoker  . Smokeless tobacco: Never Used  Substance and Sexual Activity  . Alcohol use: Never  . Drug use: Never  . Sexual activity: Not on file  Other Topics Concern  . Not on file  Social History Narrative  . Not on file   Social Determinants of Health   Financial Resource Strain:   . Difficulty of Paying Living Expenses:   Food Insecurity:   . Worried About Programme researcher, broadcasting/film/video in the Last Year:   . Barista in the Last Year:   Transportation Needs:   . Freight forwarder (Medical):   Marland Kitchen Lack of Transportation (Non-Medical):   Physical Activity:   . Days of Exercise per Week:   . Minutes of Exercise per Session:    Stress:   . Feeling of Stress :   Social Connections:   . Frequency of Communication with Friends and Family:   . Frequency of Social Gatherings with Friends and Family:   . Attends Religious Services:   . Active Member of Clubs or Organizations:   . Attends Banker Meetings:   Marland Kitchen Marital Status:   Intimate Partner Violence:   . Fear of Current or Ex-Partner:   . Emotionally Abused:   Marland Kitchen Physically Abused:   . Sexually Abused:     Outpatient Medications Prior to Visit  Medication Sig Dispense Refill  . amphetamine-dextroamphetamine (ADDERALL XR) 15 MG 24 hr capsule Take by mouth every morning.    . Cholecalciferol 25 MCG (1000 UT) CHEW Chew by mouth.    . clonazePAM (KLONOPIN) 0.5 MG tablet Take 0.25-0.5 mg by mouth 2 (two) times daily as needed.    . DULoxetine (CYMBALTA) 60 MG capsule Take 120 mg by mouth daily.    Marland Kitchen lamoTRIgine (LAMICTAL) 150 MG tablet Take 150 mg by mouth daily.    . mirtazapine (REMERON) 15 MG tablet Take 15  mg by mouth at bedtime as needed.    . phentermine 37.5 MG capsule Take 1 capsule (37.5 mg total) by mouth every morning. 30 capsule 1  . topiramate (TOPAMAX) 100 MG tablet Take 100 mg by mouth daily.     No facility-administered medications prior to visit.    No Known Allergies  Review of Systems  Constitutional: Negative for chills, fatigue and fever.  HENT: Negative for congestion, ear pain, rhinorrhea and sore throat.   Respiratory: Negative for cough and shortness of breath.   Cardiovascular: Negative for chest pain.  Gastrointestinal: Negative for abdominal pain, constipation, diarrhea, nausea and vomiting.  Psychiatric/Behavioral: Negative for dysphoric mood. The patient is not nervous/anxious.        Objective:    Physical Exam Vitals reviewed.  Constitutional:      Appearance: Normal appearance. She is normal weight.  Cardiovascular:     Rate and Rhythm: Normal rate and regular rhythm.     Heart sounds: Normal heart  sounds.  Pulmonary:     Effort: Pulmonary effort is normal. No respiratory distress.     Breath sounds: Normal breath sounds.  Neurological:     Mental Status: She is alert and oriented to person, place, and time.  Psychiatric:        Mood and Affect: Mood normal.        Behavior: Behavior normal.    BP 124/68   Pulse 99   Temp (!) 97.3 F (36.3 C)   Ht 5\' 2"  (1.575 m)   Wt 171 lb (77.6 kg)   SpO2 98%   BMI 31.28 kg/m  Wt Readings from Last 3 Encounters:  08/22/19 171 lb (77.6 kg)  08/01/19 172 lb (78 kg)  06/03/19 175 lb 9.6 oz (79.7 kg)    Health Maintenance Due  Topic Date Due  . HIV Screening  Never done  . COVID-19 Vaccine (1) Never done  . TETANUS/TDAP  Never done  . PAP SMEAR-Modifier  Never done    There are no preventive care reminders to display for this patient.   Lab Results  Component Value Date   TSH 4.468 01/30/2018   Lab Results  Component Value Date   WBC 6.4 08/02/2019   HGB 13.5 08/02/2019   HCT 39.9 08/02/2019   MCV 90 08/02/2019   PLT 208 08/02/2019   Lab Results  Component Value Date   NA 140 08/02/2019   K 3.9 08/02/2019   CO2 22 08/02/2019   GLUCOSE 99 08/02/2019   BUN 11 08/02/2019   CREATININE 1.15 (H) 08/02/2019   BILITOT 0.2 08/02/2019   ALKPHOS 86 08/02/2019   AST 12 08/02/2019   ALT 19 08/02/2019   PROT 6.3 08/02/2019   ALBUMIN 4.2 08/02/2019   CALCIUM 8.5 (L) 08/02/2019   ANIONGAP 9 01/30/2018   Lab Results  Component Value Date   CHOL 177 08/02/2019   Lab Results  Component Value Date   HDL 40 08/02/2019   Lab Results  Component Value Date   LDLCALC 116 (H) 08/02/2019   Lab Results  Component Value Date   TRIG 113 08/02/2019   Lab Results  Component Value Date   CHOLHDL 4.4 08/02/2019   Lab Results  Component Value Date   HGBA1C 5.3 08/02/2019       Assessment & Plan:  1. Plantar fasciitis of right foot Risks were discussed including bleeding, infection, increase in sugars if diabetic,  atrophy at site of injection, and increased pain.  After consent was  obtained, using sterile technique the medial right heel was prepped with betadine and alcohol.  Kenalog 20 mg and 3 ml plain Lidocaine was then injected and the needle withdrawn.  The procedure was well tolerated, but was very uncomfortable.   Watch for fever, or increased swelling or persistent pain in the joint. Call or return to clinic prn if such symptoms occur or there is failure to improve as anticipated. May also try voltaren gel. Education given.  Follow-up: No follow-ups on file.  An After Visit Summary was printed and given to the patient.  Blane Ohara [facility] [phone]

## 2019-08-25 MED ORDER — TRIAMCINOLONE ACETONIDE 40 MG/ML IJ SUSP: 20.0000 mg | Freq: Once | INTRAMUSCULAR | Status: DC

## 2019-11-05 ENCOUNTER — Encounter: Payer: Self-pay | Admitting: Family Medicine

## 2019-11-05 ENCOUNTER — Other Ambulatory Visit: Payer: Self-pay

## 2019-11-05 ENCOUNTER — Ambulatory Visit (INDEPENDENT_AMBULATORY_CARE_PROVIDER_SITE_OTHER): Payer: Medicare Other | Admitting: Family Medicine

## 2019-11-05 VITALS — BP 110/64 | HR 80 | Temp 96.9°F | Resp 16 | Ht 62.0 in | Wt 165.0 lb

## 2019-11-05 DIAGNOSIS — F332 Major depressive disorder, recurrent severe without psychotic features: Secondary | ICD-10-CM

## 2019-11-05 DIAGNOSIS — H65193 Other acute nonsuppurative otitis media, bilateral: Secondary | ICD-10-CM | POA: Diagnosis not present

## 2019-11-05 DIAGNOSIS — N3000 Acute cystitis without hematuria: Secondary | ICD-10-CM | POA: Diagnosis not present

## 2019-11-05 DIAGNOSIS — E782 Mixed hyperlipidemia: Secondary | ICD-10-CM | POA: Diagnosis not present

## 2019-11-05 DIAGNOSIS — N182 Chronic kidney disease, stage 2 (mild): Secondary | ICD-10-CM

## 2019-11-05 LAB — POCT URINALYSIS DIP (CLINITEK)
Bilirubin, UA: NEGATIVE
Glucose, UA: NEGATIVE mg/dL
Ketones, POC UA: NEGATIVE mg/dL
Nitrite, UA: NEGATIVE
Spec Grav, UA: 1.025 (ref 1.010–1.025)
Urobilinogen, UA: 0.2 E.U./dL
pH, UA: 6 (ref 5.0–8.0)

## 2019-11-05 MED ORDER — SULFAMETHOXAZOLE-TRIMETHOPRIM 800-160 MG PO TABS: 1.0000 | ORAL_TABLET | Freq: Two times a day (BID) | ORAL | 0 refills | Status: DC

## 2019-11-05 NOTE — Progress Notes (Signed)
Established Patient Office Visit  Subjective:  Patient ID: Alicia Carlson, female    DOB: May 03, 1975  Age: 44 y.o. MRN: [id number]  CC:  Chief Complaint  Patient presents with  . Hyperlipidemia    HPI [facility] presents presents for [employer].  Sleep apnea was diagnosed, but patient does not use cpap. Was told was mild and that weight loss would help. She tries to eat healthy. Not exercising. She stopped the phentermine  Mixed hyperlipidemia  Pt presents with hyperlipidemia. Tries to eat healthy. Not exercising.   CKD - saw nephrology. [place] of kidneys - mild BL atrophy. Showed Fatty Liver also.   Patietn suffers from depression. She sees psychiatry and Matthew Folks, LSW for her counselor.  Very depressed.  He is upset because her daughter apparently will not speak with her.  Her husband is a quadriplegic that she cares for.  He seems to have little affection for 30 seconds.  The patient spent 30 minutes distraught.  She reported that she is suicidal but would not go through with anything until she had life insurance.  She has no plan.  Past Medical History:  Diagnosis Date  . Bipolar disorder with depression (HCC)   . Chronic renal failure, stage 3 (moderate) 2020  . Medical history non-contributory   . Migraine headache   . Panic attacks     Past Surgical History:  Procedure Laterality Date  . biopsy of left breast    . CESAREAN SECTION    . HERNIA REPAIR     5 weeks    Family History  Problem Relation Age of Onset  . Colon cancer Mother   . Depression Mother   . Schizophrenia Mother   . CVA Mother   . Seizures Sister   . Diabetes Father   . Liver disease Father        fatty liver   . Depression Father        anger issues  . Diabetes Brother   . Depression Brother        anger issues     Social History   Socioeconomic History  . Marital status: Married    Spouse name: Not on file  . Number of children: Not on file  . Years of education: Not on  file  . Highest education level: Not on file  Occupational History  . Not on file  Tobacco Use  . Smoking status: Never Smoker  . Smokeless tobacco: Never Used  Substance and Sexual Activity  . Alcohol use: Never  . Drug use: Never  . Sexual activity: Not on file  Other Topics Concern  . Not on file  Social History Narrative  . Not on file   Social Determinants of Health   Financial Resource Strain:   . Difficulty of Paying Living Expenses:   Food Insecurity:   . Worried About Programme researcher, broadcasting/film/video in the Last Year:   . Barista in the Last Year:   Transportation Needs:   . Freight forwarder (Medical):   Marland Kitchen Lack of Transportation (Non-Medical):   Physical Activity:   . Days of Exercise per Week:   . Minutes of Exercise per Session:   Stress:   . Feeling of Stress :   Social Connections:   . Frequency of Communication with Friends and Family:   . Frequency of Social Gatherings with Friends and Family:   . Attends Religious Services:   . Active Member of Clubs or  Organizations:   . Attends Banker Meetings:   Marland Kitchen Marital Status:   Intimate Partner Violence:   . Fear of Current or Ex-Partner:   . Emotionally Abused:   Marland Kitchen Physically Abused:   . Sexually Abused:     Outpatient Medications Prior to Visit  Medication Sig Dispense Refill  . amphetamine-dextroamphetamine (ADDERALL XR) 15 MG 24 hr capsule Take by mouth every morning.    . Cholecalciferol 25 MCG (1000 UT) CHEW Chew by mouth.    . clonazePAM (KLONOPIN) 0.5 MG tablet Take 0.25-0.5 mg by mouth 2 (two) times daily as needed.    . desvenlafaxine (PRISTIQ) 50 MG 24 hr tablet Take 50 mg by mouth daily.    Marland Kitchen lamoTRIgine (LAMICTAL) 150 MG tablet Take 150 mg by mouth daily.    . phentermine 37.5 MG capsule Take 1 capsule (37.5 mg total) by mouth every morning. 30 capsule 1  . topiramate (TOPAMAX) 100 MG tablet Take 100 mg by mouth daily.    . DULoxetine (CYMBALTA) 60 MG capsule Take 120 mg by mouth  daily.    . mirtazapine (REMERON) 15 MG tablet Take 15 mg by mouth at bedtime as needed.     Facility-Administered Medications Prior to Visit  Medication Dose Route Frequency Provider Last Rate Last Admin  . triamcinolone acetonide (KENALOG-40) injection 20 mg  20 mg Intra-articular Once Lorelei Heikkila, MD        No Known Allergies  ROS Review of Systems  Constitutional: Positive for fatigue. Negative for chills and fever.  HENT: Negative for congestion, rhinorrhea and sore throat.   Respiratory: Positive for cough (dry cough, started at the beach). Negative for shortness of breath.   Cardiovascular: Negative for chest pain.  Gastrointestinal: Positive for nausea. Negative for abdominal pain, constipation, diarrhea and vomiting.  Genitourinary: Positive for urgency. Negative for dysuria.  Musculoskeletal: Positive for back pain. Negative for myalgias.  Neurological: Positive for headaches. Negative for dizziness, weakness and light-headedness.  Psychiatric/Behavioral: Negative for dysphoric mood. The patient is not nervous/anxious.       Objective:    Physical Exam Vitals reviewed.  HENT:     Head:     Comments: Mild erythema of BL ears.     Right Ear: Ear canal normal.     Left Ear: Ear canal normal.     Nose: Nose normal.     Mouth/Throat:     Mouth: Mucous membranes are moist.  Cardiovascular:     Rate and Rhythm: Normal rate and regular rhythm.     Heart sounds: Normal heart sounds.  Pulmonary:     Effort: Pulmonary effort is normal.     Breath sounds: Normal breath sounds.  Abdominal:     General: Bowel sounds are normal.     Palpations: Abdomen is soft.     Tenderness: There is abdominal tenderness (suprapubic).  Neurological:     Mental Status: She is alert and oriented to person, place, and time.  Psychiatric:     Comments: Anxious, agitated, crying.     BP (!) 110/64   Pulse 80   Temp (!) 96.9 F (36.1 C)   Resp 16   Ht 5\' 2"  (1.575 m)   Wt 165 lb  (74.8 kg)   BMI 30.18 kg/m  Wt Readings from Last 3 Encounters:  11/05/19 165 lb (74.8 kg)  08/22/19 171 lb (77.6 kg)  08/01/19 172 lb (78 kg)     Health Maintenance Due  Topic Date Due  .  Hepatitis C Screening  Never done  . COVID-19 Vaccine (1) Never done  . HIV Screening  Never done  . TETANUS/TDAP  Never done  . PAP SMEAR-Modifier  Never done  . INFLUENZA VACCINE  11/10/2019    Lab Results  Component Value Date   WBC 6.4 08/02/2019   HGB 13.5 08/02/2019   HCT 39.9 08/02/2019   MCV 90 08/02/2019   PLT 208 08/02/2019   Lab Results  Component Value Date   NA 140 08/02/2019   K 3.9 08/02/2019   CO2 22 08/02/2019   GLUCOSE 99 08/02/2019   BUN 11 08/02/2019   CREATININE 1.15 (H) 08/02/2019   BILITOT 0.2 08/02/2019   ALKPHOS 86 08/02/2019   AST 12 08/02/2019   ALT 19 08/02/2019   PROT 6.3 08/02/2019   ALBUMIN 4.2 08/02/2019   CALCIUM 8.5 (L) 08/02/2019   ANIONGAP 9 01/30/2018   Lab Results  Component Value Date   CHOL 177 08/02/2019   Lab Results  Component Value Date   HDL 40 08/02/2019   Lab Results  Component Value Date   LDLCALC 116 (H) 08/02/2019   Lab Results  Component Value Date   TRIG 113 08/02/2019   Lab Results  Component Value Date   CHOLHDL 4.4 08/02/2019   Lab Results  Component Value Date   HGBA1C 5.3 08/02/2019      Assessment & Plan:  1. Acute cystitis without hematuria Start on bactrim ds.  - POCT URINALYSIS DIP (CLINITEK) - Urine Culture  2. Other non-recurrent acute nonsuppurative otitis media of both ears Start on bactrim ds.  3. CKD (chronic kidney disease) stage 2, GFR 60-89 ml/min [place]rin[place] to follow up with nephrology.   4. Mixed hyperlipidemia Recommend continue to work on eating healthy diet and exercise.  5. Severe episode of recurrent major depressive disorder, without psychotic features (HCC) Contracted for safety.  Behavior is concerning for Borderline personality disorder. I spoke with Matthew Folks,  LSW who is going to reach out to her about perhaps getting her in sooner if needed.    Meds ordered this encounter  Medications  . sulfamethoxazole-trimethoprim (BACTRIM DS) 800-160 MG tablet    Sig: Take 1 tablet by mouth 2 (two) times daily.    Dispense:  20 tablet    Refill:  0    Follow-up: Return in about 4 weeks (around 12/03/2019) for fasting.    Blane Ohara, MD

## 2019-11-06 LAB — URINE CULTURE

## 2019-11-07 ENCOUNTER — Ambulatory Visit: Payer: Medicare Other | Admitting: Family Medicine

## 2019-11-17 ENCOUNTER — Encounter: Payer: Self-pay | Admitting: Family Medicine

## 2019-11-17 DIAGNOSIS — N3000 Acute cystitis without hematuria: Secondary | ICD-10-CM | POA: Insufficient documentation

## 2019-11-17 DIAGNOSIS — H6690 Otitis media, unspecified, unspecified ear: Secondary | ICD-10-CM | POA: Insufficient documentation

## 2019-12-20 ENCOUNTER — Other Ambulatory Visit: Payer: Self-pay

## 2019-12-20 ENCOUNTER — Encounter: Payer: Self-pay | Admitting: Family Medicine

## 2019-12-20 ENCOUNTER — Ambulatory Visit (INDEPENDENT_AMBULATORY_CARE_PROVIDER_SITE_OTHER): Payer: Medicare Other | Admitting: Family Medicine

## 2019-12-20 VITALS — BP 110/82 | HR 107 | Temp 96.1°F | Ht 62.0 in | Wt 165.0 lb

## 2019-12-20 DIAGNOSIS — J0100 Acute maxillary sinusitis, unspecified: Secondary | ICD-10-CM

## 2019-12-20 DIAGNOSIS — G43111 Migraine with aura, intractable, with status migrainosus: Secondary | ICD-10-CM | POA: Diagnosis not present

## 2019-12-20 DIAGNOSIS — H65193 Other acute nonsuppurative otitis media, bilateral: Secondary | ICD-10-CM | POA: Diagnosis not present

## 2019-12-20 LAB — POC COVID19 BINAXNOW: SARS Coronavirus 2 Ag: NEGATIVE

## 2019-12-20 MED ORDER — KETOROLAC TROMETHAMINE 60 MG/2ML IM SOLN
60.0000 mg | Freq: Once | INTRAMUSCULAR | Status: AC
  Administered 2019-12-20: 60 mg via INTRAMUSCULAR

## 2019-12-20 MED ORDER — AMOXICILLIN-POT CLAVULANATE 875-125 MG PO TABS: 1.0000 | ORAL_TABLET | Freq: Two times a day (BID) | ORAL | 0 refills | Status: DC

## 2019-12-20 NOTE — Progress Notes (Signed)
Acute Office Visit  Subjective:    Patient ID: Alicia Carlson, female    DOB: [birth year], 44 y.o.   MRN: [id number]  Chief Complaint  Patient presents with  . Headache  . Depression    HPI Patient is in today for headache x 3 days. Tylenol does not work, Midol helps some but does not last long. Topamax helped when it was 200 mg however the lower dose does not control her headaches. Is bothered light. Patient is currently being weaned off Pristiq and changed over to [employer]. States she is quitting her job due to her headaches and depression. Had to have a emergency session with her Psychologist due to suicidal thoughts. Does not want to even shower or clean her house due to her depression.  Past Medical History:  Diagnosis Date  . Bipolar disorder with depression (HCC)   . Chronic renal failure, stage 3 (moderate) 2020  . Medical history non-contributory   . Migraine headache   . Panic attacks     Past Surgical History:  Procedure Laterality Date  . biopsy of left breast    . CESAREAN SECTION    . HERNIA REPAIR     5 weeks    Family History  Problem Relation Age of Onset  . Colon cancer Mother   . Depression Mother   . Schizophrenia Mother   . CVA Mother   . Seizures Sister   . Diabetes Father   . Liver disease Father        fatty liver   . Depression Father        anger issues  . Diabetes Brother   . Depression Brother        anger issues     Social History   Socioeconomic History  . Marital status: Married    Spouse name: Not on file  . Number of children: Not on file  . Years of education: Not on file  . Highest education level: Not on file  Occupational History  . Not on file  Tobacco Use  . Smoking status: Never Smoker  . Smokeless tobacco: Never Used  Substance and Sexual Activity  . Alcohol use: Never  . Drug use: Never  . Sexual activity: Not on file  Other Topics Concern  . Not on file  Social History Narrative  . Not on file   Social  Determinants of Health   Financial Resource Strain:   . Difficulty of Paying Living Expenses: Not on file  Food Insecurity:   . Worried About Programme researcher, broadcasting/film/video in the Last Year: Not on file  . Ran Out of Food in the Last Year: Not on file  Transportation Needs:   . Lack of Transportation (Medical): Not on file  . Lack of Transportation (Non-Medical): Not on file  Physical Activity:   . Days of Exercise per Week: Not on file  . Minutes of Exercise per Session: Not on file  Stress:   . Feeling of Stress : Not on file  Social Connections:   . Frequency of Communication with Friends and Family: Not on file  . Frequency of Social Gatherings with Friends and Family: Not on file  . Attends Religious Services: Not on file  . Active Member of Clubs or Organizations: Not on file  . Attends Banker Meetings: Not on file  . Marital Status: Not on file  Intimate Partner Violence:   . Fear of Current or Ex-Partner: Not on file  .  Emotionally Abused: Not on file  . Physically Abused: Not on file  . Sexually Abused: Not on file    Outpatient Medications Prior to Visit  Medication Sig Dispense Refill  . amphetamine-dextroamphetamine (ADDERALL XR) 15 MG 24 hr capsule Take by mouth every morning.    . clonazePAM (KLONOPIN) 0.5 MG tablet Take 0.25-0.5 mg by mouth 2 (two) times daily as needed.    . lamoTRIgine (LAMICTAL) 150 MG tablet Take 150 mg by mouth daily.    . phentermine 37.5 MG capsule Take 1 capsule (37.5 mg total) by mouth every morning. 30 capsule 1  . topiramate (TOPAMAX) 100 MG tablet Take 100 mg by mouth daily.    Marland Kitchen VRAYLAR capsule Take 1.5 mg by mouth daily.    . Cholecalciferol 25 MCG (1000 UT) CHEW Chew by mouth.    . desvenlafaxine (PRISTIQ) 50 MG 24 hr tablet Take 50 mg by mouth daily.    Marland Kitchen sulfamethoxazole-trimethoprim (BACTRIM DS) 800-160 MG tablet Take 1 tablet by mouth 2 (two) times daily. 20 tablet 0  . triamcinolone acetonide (KENALOG-40) injection 20 mg       No facility-administered medications prior to visit.    No Known Allergies  Review of Systems  Constitutional: Positive for fatigue. Negative for chills and fever.  HENT: Positive for rhinorrhea. Negative for congestion, ear pain and sore throat.   Eyes: Positive for photophobia.  Respiratory: Negative for cough and shortness of breath.   Cardiovascular: Negative for chest pain.  Gastrointestinal: Positive for nausea. Negative for abdominal pain, constipation, diarrhea and vomiting.  Neurological: Positive for headaches. Negative for dizziness.  Psychiatric/Behavioral: Positive for dysphoric mood. The patient is nervous/anxious.        Objective:    Physical Exam Vitals reviewed.  Constitutional:      Appearance: Normal appearance. She is well-developed and normal weight.  HENT:     Right Ear: External ear normal.     Left Ear: External ear normal.     Ears:     Comments: BL TMs Erythema. Sinus tenderness BL.     Nose: Congestion (erythema BL nares) present.     Mouth/Throat:     Mouth: Mucous membranes are moist.     Pharynx: Oropharynx is clear. No oropharyngeal exudate or posterior oropharyngeal erythema.  Neck:     Vascular: No carotid bruit.  Cardiovascular:     Rate and Rhythm: Normal rate and regular rhythm.     Pulses: Normal pulses.     Heart sounds: Normal heart sounds. No murmur heard.   Pulmonary:     Effort: Pulmonary effort is normal. No respiratory distress.     Breath sounds: Normal breath sounds.  Abdominal:     General: Abdomen is flat. Bowel sounds are normal.     Palpations: Abdomen is soft.     Tenderness: There is no abdominal tenderness.  Neurological:     Mental Status: She is alert and oriented to person, place, and time.  Psychiatric:        Mood and Affect: Mood normal.        Behavior: Behavior normal.     BP 110/82   Pulse (!) 107   Temp (!) 96.1 F (35.6 C)   Ht 5\' 2"  (1.575 m)   Wt 165 lb (74.8 kg)   SpO2 99%   BMI 30.18  kg/m  Wt Readings from Last 3 Encounters:  12/20/19 165 lb (74.8 kg)  11/05/19 165 lb (74.8 kg)  08/22/19 171 lb (77.6  kg)    Health Maintenance Due  Topic Date Due  . Hepatitis C Screening  Never done  . COVID-19 Vaccine (1) Never done  . HIV Screening  Never done  . TETANUS/TDAP  Never done  . PAP SMEAR-Modifier  Never done  . INFLUENZA VACCINE  Never done    There are no preventive care reminders to display for this patient.   Lab Results  Component Value Date   TSH 4.468 01/30/2018   Lab Results  Component Value Date   WBC 6.4 08/02/2019   HGB 13.5 08/02/2019   HCT 39.9 08/02/2019   MCV 90 08/02/2019   PLT 208 08/02/2019   Lab Results  Component Value Date   NA 140 08/02/2019   K 3.9 08/02/2019   CO2 22 08/02/2019   GLUCOSE 99 08/02/2019   BUN 11 08/02/2019   CREATININE 1.15 (H) 08/02/2019   BILITOT 0.2 08/02/2019   ALKPHOS 86 08/02/2019   AST 12 08/02/2019   ALT 19 08/02/2019   PROT 6.3 08/02/2019   ALBUMIN 4.2 08/02/2019   CALCIUM 8.5 (L) 08/02/2019   ANIONGAP 9 01/30/2018   Lab Results  Component Value Date   CHOL 177 08/02/2019   Lab Results  Component Value Date   HDL 40 08/02/2019   Lab Results  Component Value Date   LDLCALC 116 (H) 08/02/2019   Lab Results  Component Value Date   TRIG 113 08/02/2019   Lab Results  Component Value Date   CHOLHDL 4.4 08/02/2019   Lab Results  Component Value Date   HGBA1C 5.3 08/02/2019       Assessment & Plan:  1. Acute non-recurrent maxillary sinusitis Start rx for augmentin. - POC COVID-19 negative. - amoxicillin-clavulanate (AUGMENTIN) 875-125 MG tablet; Take 1 tablet by mouth 2 (two) times daily.  Dispense: 20 tablet; Refill: 0 - Novel Coronavirus, NAA ([facility])  2. Acute nonsuppurative otitis media, bilateral - amoxicillin-clavulanate (AUGMENTIN) 875-125 MG tablet; Take 1 tablet by mouth 2 (two) times daily.  Dispense: 20 tablet; Refill: 0  3. Intractable migraine with aura with  status migrainosus Nurtec given. - ketorolac (TORADOL) injection 60 mg    Meds ordered this encounter  Medications  . amoxicillin-clavulanate (AUGMENTIN) 875-125 MG tablet    Sig: Take 1 tablet by mouth 2 (two) times daily.    Dispense:  20 tablet    Refill:  0  . ketorolac (TORADOL) injection 60 mg    Orders Placed This Encounter  Procedures  . Novel Coronavirus, NAA ([facility])  . POC COVID-19     Follow-up: Return if symptoms worsen or fail to improve.  An After Visit Summary was printed and given to the patient.  Blane Ohara [facility] [phone]

## 2019-12-22 ENCOUNTER — Encounter: Payer: Self-pay | Admitting: Family Medicine

## 2019-12-22 LAB — NOVEL CORONAVIRUS, NAA: SARS-CoV-2, NAA: NOT DETECTED

## 2019-12-24 ENCOUNTER — Ambulatory Visit: Payer: Medicare Other | Admitting: Legal Medicine

## 2019-12-24 ENCOUNTER — Encounter: Payer: Self-pay | Admitting: Legal Medicine

## 2019-12-24 ENCOUNTER — Ambulatory Visit (INDEPENDENT_AMBULATORY_CARE_PROVIDER_SITE_OTHER): Payer: Medicare Other | Admitting: Legal Medicine

## 2019-12-24 ENCOUNTER — Other Ambulatory Visit: Payer: Self-pay

## 2019-12-24 DIAGNOSIS — M722 Plantar fascial fibromatosis: Secondary | ICD-10-CM | POA: Insufficient documentation

## 2019-12-24 NOTE — Progress Notes (Signed)
Acute Office Visit  Subjective:    Patient ID: Alicia Carlson, female    DOB: [birth year], 44 y.o.   MRN: [id number]  Chief Complaint  Patient presents with  . Foot Pain    HPI Patient is in today for plantar fasciitis right foot.  It is recurrent.  She had injection in May with good results. She is asking for another injection  Past Medical History:  Diagnosis Date  . Bipolar disorder with depression (HCC)   . Chronic renal failure, stage 3 (moderate) 2020  . Medical history non-contributory   . Migraine headache   . Panic attacks     Past Surgical History:  Procedure Laterality Date  . biopsy of left breast    . CESAREAN SECTION    . HERNIA REPAIR     5 weeks    Family History  Problem Relation Age of Onset  . Colon cancer Mother   . Depression Mother   . Schizophrenia Mother   . CVA Mother   . Seizures Sister   . Diabetes Father   . Liver disease Father        fatty liver   . Depression Father        anger issues  . Diabetes Brother   . Depression Brother        anger issues     Social History   Socioeconomic History  . Marital status: Married    Spouse name: Not on file  . Number of children: Not on file  . Years of education: Not on file  . Highest education level: Not on file  Occupational History  . Not on file  Tobacco Use  . Smoking status: Never Smoker  . Smokeless tobacco: Never Used  Substance and Sexual Activity  . Alcohol use: Never  . Drug use: Never  . Sexual activity: Not on file  Other Topics Concern  . Not on file  Social History Narrative  . Not on file   Social Determinants of Health   Financial Resource Strain:   . Difficulty of Paying Living Expenses: Not on file  Food Insecurity:   . Worried About Programme researcher, broadcasting/film/video in the Last Year: Not on file  . Ran Out of Food in the Last Year: Not on file  Transportation Needs:   . Lack of Transportation (Medical): Not on file  . Lack of Transportation (Non-Medical): Not on  file  Physical Activity:   . Days of Exercise per Week: Not on file  . Minutes of Exercise per Session: Not on file  Stress:   . Feeling of Stress : Not on file  Social Connections:   . Frequency of Communication with Friends and Family: Not on file  . Frequency of Social Gatherings with Friends and Family: Not on file  . Attends Religious Services: Not on file  . Active Member of Clubs or Organizations: Not on file  . Attends Banker Meetings: Not on file  . Marital Status: Not on file  Intimate Partner Violence:   . Fear of Current or Ex-Partner: Not on file  . Emotionally Abused: Not on file  . Physically Abused: Not on file  . Sexually Abused: Not on file    Outpatient Medications Prior to Visit  Medication Sig Dispense Refill  . amoxicillin-clavulanate (AUGMENTIN) 875-125 MG tablet Take 1 tablet by mouth 2 (two) times daily. 20 tablet 0  . amphetamine-dextroamphetamine (ADDERALL XR) 15 MG 24 hr capsule Take by mouth every  morning.    . clonazePAM (KLONOPIN) 0.5 MG tablet Take 0.25-0.5 mg by mouth 2 (two) times daily as needed.    . lamoTRIgine (LAMICTAL) 150 MG tablet Take 150 mg by mouth daily.    . phentermine 37.5 MG capsule Take 1 capsule (37.5 mg total) by mouth every morning. 30 capsule 1  . topiramate (TOPAMAX) 100 MG tablet Take 100 mg by mouth daily.    Marland Kitchen VRAYLAR capsule Take 1.5 mg by mouth daily.     No facility-administered medications prior to visit.    No Known Allergies  Review of Systems  Constitutional: Negative.   HENT: Negative.   Respiratory: Negative for cough and shortness of breath.   Cardiovascular: Negative for chest pain, palpitations and leg swelling.  Endocrine: Negative.   Genitourinary: Negative.   Musculoskeletal:       Foot pain right  Skin: Negative.   Neurological: Negative.   Psychiatric/Behavioral: Negative.        Objective:    Physical Exam Vitals reviewed.  Constitutional:      Appearance: Normal  appearance.  HENT:     Left Ear: Tympanic membrane normal.     Ears:     Comments: Right ear red    Nose: Nose normal.     Mouth/Throat:     Mouth: Mucous membranes are moist.  Eyes:     Extraocular Movements: Extraocular movements intact.     Conjunctiva/sclera: Conjunctivae normal.     Pupils: Pupils are equal, round, and reactive to light.  Cardiovascular:     Rate and Rhythm: Normal rate and regular rhythm.     Pulses: Normal pulses.     Heart sounds: Normal heart sounds.  Pulmonary:     Effort: Pulmonary effort is normal.     Breath sounds: Normal breath sounds.  Abdominal:     General: Abdomen is flat. Bowel sounds are normal.  Musculoskeletal:       Feet:  Feet:     Comments: She has plain over plantar fascia and at insertion of left tibialis posterior tendon.  Negative tinel over tarsal tunnel Neurological:     Mental Status: She is alert.     BP 130/80   Pulse (!) 104   Temp (!) 97 F (36.1 C)   Resp 16   Ht 5\' 2"  (1.575 m)   Wt 168 lb (76.2 kg)   SpO2 98%   BMI 30.73 kg/m  Wt Readings from Last 3 Encounters:  12/24/19 168 lb (76.2 kg)  12/20/19 165 lb (74.8 kg)  11/05/19 165 lb (74.8 kg)    Health Maintenance Due  Topic Date Due  . Hepatitis C Screening  Never done  . COVID-19 Vaccine (1) Never done  . HIV Screening  Never done  . TETANUS/TDAP  Never done  . PAP SMEAR-Modifier  Never done  . INFLUENZA VACCINE  Never done    There are no preventive care reminders to display for this patient.   Lab Results  Component Value Date   TSH 4.468 01/30/2018   Lab Results  Component Value Date   WBC 6.4 08/02/2019   HGB 13.5 08/02/2019   HCT 39.9 08/02/2019   MCV 90 08/02/2019   PLT 208 08/02/2019   Lab Results  Component Value Date   NA 140 08/02/2019   K 3.9 08/02/2019   CO2 22 08/02/2019   GLUCOSE 99 08/02/2019   BUN 11 08/02/2019   CREATININE 1.15 (H) 08/02/2019   BILITOT 0.2 08/02/2019  ALKPHOS 86 08/02/2019   AST 12 08/02/2019    ALT 19 08/02/2019   PROT 6.3 08/02/2019   ALBUMIN 4.2 08/02/2019   CALCIUM 8.5 (L) 08/02/2019   ANIONGAP 9 01/30/2018   Lab Results  Component Value Date   CHOL 177 08/02/2019   Lab Results  Component Value Date   HDL 40 08/02/2019   Lab Results  Component Value Date   LDLCALC 116 (H) 08/02/2019   Lab Results  Component Value Date   TRIG 113 08/02/2019   Lab Results  Component Value Date   CHOLHDL 4.4 08/02/2019   Lab Results  Component Value Date   HGBA1C 5.3 08/02/2019       Assessment & Plan:  1. Plantar fasciitis of right foot Patient is having increased plantar fasciitis.  It improved with  Injection.  She stopped her stretching exercises.  After consent was obtained, using sterile technique the plantar fascia was prepped andEthyl Chloride was used as local anesthetic. Marland Kitchen  Steroid 40 mg and 1 ml plain Lidocaine was then injected and the needle withdrawn.  The procedure was well tolerated.  The patient is asked to continue to rest the joint for a few more days before resuming regular activities.  It may be more painful for the first 1-2 days.  Watch for fever, or increased swelling or persistent pain in the joint. Call or return to clinic prn if such symptoms occur or there is failure to improve as anticipated.       Follow-up: Return if symptoms worsen or fail to improve.  An After Visit Summary was printed and given to the patient.  [facility] [phone]

## 2019-12-27 ENCOUNTER — Other Ambulatory Visit: Payer: Self-pay

## 2019-12-27 ENCOUNTER — Ambulatory Visit (INDEPENDENT_AMBULATORY_CARE_PROVIDER_SITE_OTHER): Payer: Medicare Other | Admitting: Family Medicine

## 2019-12-27 VITALS — BP 110/70 | HR 78 | Temp 95.7°F | Resp 16 | Ht 62.0 in | Wt 169.2 lb

## 2019-12-27 DIAGNOSIS — M545 Low back pain, unspecified: Secondary | ICD-10-CM

## 2019-12-27 DIAGNOSIS — M722 Plantar fascial fibromatosis: Secondary | ICD-10-CM | POA: Diagnosis not present

## 2019-12-27 MED ORDER — DICLOFENAC EPOLAMINE 1.3 % EX PTCH: 1.0000 | MEDICATED_PATCH | Freq: Two times a day (BID) | CUTANEOUS | 2 refills | Status: DC

## 2019-12-27 MED ORDER — TRIAMCINOLONE ACETONIDE 40 MG/ML IJ SUSP: 30.0000 mg | Freq: Once | INTRAMUSCULAR | Status: DC

## 2019-12-27 NOTE — Patient Instructions (Signed)

## 2019-12-27 NOTE — Progress Notes (Signed)
Acute Office Visit  Subjective:    Patient ID: Alicia Carlson, female    DOB: 1975-10-15, 44 y.o.   MRN: [id number]  Chief Complaint  Patient presents with  . Back Pain  . right heel pain    HPI Patient is in today for rt heel pain. Has plantar fasciitis. Got an injection from Dr. Marina Goodell 2-3 weeks ago. She is requesting I give her an injection.    Patient is back pain (lumbar.) midline. She cannot take nsaids due to kidney dysfunction. She has tried lidocaine patches of her husbands. They are not helping anymore.  Past Medical History:  Diagnosis Date  . Bipolar disorder with depression (HCC)   . Chronic renal failure, stage 3 (moderate) 2020  . Medical history non-contributory   . Migraine headache   . Panic attacks     Past Surgical History:  Procedure Laterality Date  . biopsy of left breast    . CESAREAN SECTION    . HERNIA REPAIR     5 weeks    Family History  Problem Relation Age of Onset  . Colon cancer Mother   . Depression Mother   . Schizophrenia Mother   . CVA Mother   . Seizures Sister   . Diabetes Father   . Liver disease Father        fatty liver   . Depression Father        anger issues  . Diabetes Brother   . Depression Brother        anger issues     Social History   Socioeconomic History  . Marital status: Married    Spouse name: Not on file  . Number of children: Not on file  . Years of education: Not on file  . Highest education level: Not on file  Occupational History  . Not on file  Tobacco Use  . Smoking status: Never Smoker  . Smokeless tobacco: Never Used  Substance and Sexual Activity  . Alcohol use: Never  . Drug use: Never  . Sexual activity: Not on file  Other Topics Concern  . Not on file  Social History Narrative  . Not on file   Social Determinants of Health   Financial Resource Strain:   . Difficulty of Paying Living Expenses: Not on file  Food Insecurity:   . Worried About Programme researcher, broadcasting/film/video in the Last  Year: Not on file  . Ran Out of Food in the Last Year: Not on file  Transportation Needs:   . Lack of Transportation (Medical): Not on file  . Lack of Transportation (Non-Medical): Not on file  Physical Activity:   . Days of Exercise per Week: Not on file  . Minutes of Exercise per Session: Not on file  Stress:   . Feeling of Stress : Not on file  Social Connections:   . Frequency of Communication with Friends and Family: Not on file  . Frequency of Social Gatherings with Friends and Family: Not on file  . Attends Religious Services: Not on file  . Active Member of Clubs or Organizations: Not on file  . Attends Banker Meetings: Not on file  . Marital Status: Not on file  Intimate Partner Violence:   . Fear of Current or Ex-Partner: Not on file  . Emotionally Abused: Not on file  . Physically Abused: Not on file  . Sexually Abused: Not on file    Outpatient Medications Prior to Visit  Medication Sig  Dispense Refill  . amoxicillin-clavulanate (AUGMENTIN) 875-125 MG tablet Take 1 tablet by mouth 2 (two) times daily. 20 tablet 0  . amphetamine-dextroamphetamine (ADDERALL XR) 15 MG 24 hr capsule Take by mouth every morning.    . clonazePAM (KLONOPIN) 0.5 MG tablet Take 0.25-0.5 mg by mouth 2 (two) times daily as needed.    . lamoTRIgine (LAMICTAL) 150 MG tablet Take 150 mg by mouth daily.    . phentermine 37.5 MG capsule Take 1 capsule (37.5 mg total) by mouth every morning. 30 capsule 1  . topiramate (TOPAMAX) 100 MG tablet Take 100 mg by mouth daily.    Marland Kitchen VRAYLAR capsule Take 1.5 mg by mouth daily.     No facility-administered medications prior to visit.    No Known Allergies  Review of Systems  Constitutional: Negative for chills, fatigue and fever.  HENT: Negative for congestion, ear pain and sore throat.   Respiratory: Negative for cough and shortness of breath.   Cardiovascular: Negative for chest pain.  Gastrointestinal: Negative for abdominal pain,  constipation, diarrhea, nausea and vomiting.  Genitourinary: Negative for dysuria and urgency.  Musculoskeletal: Positive for arthralgias and back pain. Negative for myalgias.  Skin: Negative for rash.  Neurological: Negative for dizziness and headaches.  Psychiatric/Behavioral: Negative for dysphoric mood. The patient is not nervous/anxious.        Objective:    Physical Exam Vitals reviewed.  Constitutional:      Appearance: Normal appearance. She is normal weight.  Cardiovascular:     Rate and Rhythm: Normal rate and regular rhythm.     Pulses: Normal pulses.     Heart sounds: Normal heart sounds.  Pulmonary:     Effort: Pulmonary effort is normal. No respiratory distress.     Breath sounds: Normal breath sounds.  Abdominal:     General: Abdomen is flat. Bowel sounds are normal.     Palpations: Abdomen is soft.     Tenderness: There is no abdominal tenderness.  Musculoskeletal:        General: Tenderness (lumbar BL paraspinal muscles. Rt heel tender medially.) present.  Neurological:     Mental Status: She is alert and oriented to person, place, and time.  Psychiatric:        Mood and Affect: Mood normal.        Behavior: Behavior normal.    BP 110/70   Pulse 78   Temp (!) 95.7 F (35.4 C)   Resp 16   Ht 5\' 2"  (1.575 m)   Wt 169 lb 3.2 oz (76.7 kg)   BMI 30.95 kg/m  Wt Readings from Last 3 Encounters:  12/27/19 169 lb 3.2 oz (76.7 kg)  12/24/19 168 lb (76.2 kg)  12/20/19 165 lb (74.8 kg)    Health Maintenance Due  Topic Date Due  . Hepatitis C Screening  Never done  . COVID-19 Vaccine (1) Never done  . HIV Screening  Never done  . TETANUS/TDAP  Never done  . PAP SMEAR-Modifier  Never done  . INFLUENZA VACCINE  Never done    There are no preventive care reminders to display for this patient.   Lab Results  Component Value Date   TSH 4.468 01/30/2018   Lab Results  Component Value Date   WBC 6.4 08/02/2019   HGB 13.5 08/02/2019   HCT 39.9  08/02/2019   MCV 90 08/02/2019   PLT 208 08/02/2019   Lab Results  Component Value Date   NA 140 08/02/2019   K 3.9  08/02/2019   CO2 22 08/02/2019   GLUCOSE 99 08/02/2019   BUN 11 08/02/2019   CREATININE 1.15 (H) 08/02/2019   BILITOT 0.2 08/02/2019   ALKPHOS 86 08/02/2019   AST 12 08/02/2019   ALT 19 08/02/2019   PROT 6.3 08/02/2019   ALBUMIN 4.2 08/02/2019   CALCIUM 8.5 (L) 08/02/2019   ANIONGAP 9 01/30/2018   Lab Results  Component Value Date   CHOL 177 08/02/2019   Lab Results  Component Value Date   HDL 40 08/02/2019   Lab Results  Component Value Date   LDLCALC 116 (H) 08/02/2019   Lab Results  Component Value Date   TRIG 113 08/02/2019   Lab Results  Component Value Date   CHOLHDL 4.4 08/02/2019   Lab Results  Component Value Date   HGBA1C 5.3 08/02/2019       Assessment & Plan:  1. Lumbar back pain - Ambulatory referral to Physical Therapy - diclofenac (FLECTOR) 1.3 % PTCH; Place 1 patch onto the skin 2 (two) times daily.  Dispense: 60 patch; Refill: 2  2. Plantar fasciitis of right foot - Ambulatory referral to Physical Therapy - triamcinolone acetonide (KENALOG-40) injection 30 mg Risks were discussed including bleeding, infection, increase in sugars if diabetic, atrophy at site of injection, and increased pain.  After consent was obtained, using sterile technique the right medial plantar surface was prepped with alcohol.  Kenalog 30 mg and 3 ml plain Lidocaine was then injected and the needle withdrawn.  The procedure was well tolerated.   The patient is asked to continue to rest the joint for a few more days before resuming regular activities.  It may be more painful for the first 1-2 days.  Watch for fever, or increased swelling or persistent pain in the joint. Call or return to clinic prn if such symptoms occur or there is failure to improve as anticipated.  Meds ordered this encounter  Medications  . diclofenac (FLECTOR) 1.3 % PTCH    Sig:  Place 1 patch onto the skin 2 (two) times daily.    Dispense:  60 patch    Refill:  2    Kidney dysfunction. Unable to take nsaids.  . triamcinolone acetonide (KENALOG-40) injection 30 mg    Orders Placed This Encounter  Procedures  . Ambulatory referral to Physical Therapy     Follow-up: Return in about 6 weeks (around 02/07/2020).  An After Visit Summary was printed and given to the patient.  Blane Ohara [facility] [phone]

## 2019-12-29 ENCOUNTER — Encounter: Payer: Self-pay | Admitting: Family Medicine

## 2020-01-01 ENCOUNTER — Other Ambulatory Visit: Payer: Self-pay

## 2020-01-01 DIAGNOSIS — J0100 Acute maxillary sinusitis, unspecified: Secondary | ICD-10-CM

## 2020-01-07 NOTE — Progress Notes (Signed)
Acute Office Visit  Subjective:    Patient ID: Alicia Carlson, female    DOB: 1975/08/29, 44 y.o.   MRN: [id number]  Chief Complaint  Patient presents with  . Headache    HPI Follow up on migraine.  Patient was seen on September to have 2021 for a migraine that lasted 3 days.  I treated her for sinusitis with Augmentin.  I also gave her a Toradol shot for her migraine.  These did seem to help.  She is currently taking Topamax 100 mg once daily for migraine prevention.  This worked when it was at 200 mg however it was decreased due to her stage III chronic kidney disease. For acute migraines: has never been on triptan. Has had 4/migraines per month since having to drop dose of topamax. Chronic daily headaches also.  Past Medical History:  Diagnosis Date  . Bipolar disorder with depression (HCC)   . Chronic renal failure, stage 3 (moderate) 2020  . Medical history non-contributory   . Migraine headache   . Panic attacks     Past Surgical History:  Procedure Laterality Date  . biopsy of left breast    . CESAREAN SECTION    . HERNIA REPAIR     5 weeks    Family History  Problem Relation Age of Onset  . Colon cancer Mother   . Depression Mother   . Schizophrenia Mother   . CVA Mother   . Seizures Sister   . Diabetes Father   . Liver disease Father        fatty liver   . Depression Father        anger issues  . Diabetes Brother   . Depression Brother        anger issues     Social History   Socioeconomic History  . Marital status: Married    Spouse name: Not on file  . Number of children: Not on file  . Years of education: Not on file  . Highest education level: Not on file  Occupational History  . Not on file  Tobacco Use  . Smoking status: Never Smoker  . Smokeless tobacco: Never Used  Substance and Sexual Activity  . Alcohol use: Never  . Drug use: Never  . Sexual activity: Not on file  Other Topics Concern  . Not on file  Social History Narrative  .  Not on file   Social Determinants of Health   Financial Resource Strain:   . Difficulty of Paying Living Expenses: Not on file  Food Insecurity:   . Worried About Programme researcher, broadcasting/film/video in the Last Year: Not on file  . Ran Out of Food in the Last Year: Not on file  Transportation Needs:   . Lack of Transportation (Medical): Not on file  . Lack of Transportation (Non-Medical): Not on file  Physical Activity:   . Days of Exercise per Week: Not on file  . Minutes of Exercise per Session: Not on file  Stress:   . Feeling of Stress : Not on file  Social Connections:   . Frequency of Communication with Friends and Family: Not on file  . Frequency of Social Gatherings with Friends and Family: Not on file  . Attends Religious Services: Not on file  . Active Member of Clubs or Organizations: Not on file  . Attends Banker Meetings: Not on file  . Marital Status: Not on file  Intimate Partner Violence:   . Fear  of Current or Ex-Partner: Not on file  . Emotionally Abused: Not on file  . Physically Abused: Not on file  . Sexually Abused: Not on file    Outpatient Medications Prior to Visit  Medication Sig Dispense Refill  . amphetamine-dextroamphetamine (ADDERALL XR) 15 MG 24 hr capsule Take by mouth every morning.    . clonazePAM (KLONOPIN) 0.5 MG tablet Take 0.25-0.5 mg by mouth 2 (two) times daily as needed.    . diclofenac (FLECTOR) 1.3 % PTCH Place 1 patch onto the skin 2 (two) times daily. 60 patch 2  . lamoTRIgine (LAMICTAL) 150 MG tablet Take 150 mg by mouth daily.    . phentermine 37.5 MG capsule Take 1 capsule (37.5 mg total) by mouth every morning. 30 capsule 1  . topiramate (TOPAMAX) 100 MG tablet Take 100 mg by mouth daily.    Marland Kitchen VRAYLAR capsule Take 1.5 mg by mouth daily.    Marland Kitchen amoxicillin-clavulanate (AUGMENTIN) 875-125 MG tablet Take 1 tablet by mouth 2 (two) times daily. 20 tablet 0  . triamcinolone acetonide (KENALOG-40) injection 30 mg      No  facility-administered medications prior to visit.    No Known Allergies  Review of Systems  Constitutional: Positive for fatigue. Negative for chills and fever.  HENT: Positive for rhinorrhea. Negative for congestion, ear pain and sore throat.   Eyes: Negative for photophobia.  Respiratory: Negative for cough and shortness of breath.   Cardiovascular: Negative for chest pain.  Gastrointestinal: Negative for abdominal pain, constipation, diarrhea, nausea and vomiting.  Neurological: Positive for headaches. Negative for dizziness.  Psychiatric/Behavioral: Negative for dysphoric mood. The patient is nervous/anxious.        Objective:    Physical Exam Vitals reviewed.  Constitutional:      Appearance: She is well-developed.  Cardiovascular:     Rate and Rhythm: Normal rate and regular rhythm.     Heart sounds: Normal heart sounds.  Pulmonary:     Effort: Pulmonary effort is normal.     Breath sounds: Normal breath sounds.  Neurological:     Mental Status: She is alert.  Psychiatric:        Mood and Affect: Mood is anxious. Mood is not depressed.    BP 118/70   Pulse 81   Temp (!) 97.4 F (36.3 C)   Ht 5\' 2"  (1.575 m)   Wt 168 lb (76.2 kg)   SpO2 98%   BMI 30.73 kg/m  Wt Readings from Last 3 Encounters:  01/08/20 168 lb (76.2 kg)  12/27/19 169 lb 3.2 oz (76.7 kg)  12/24/19 168 lb (76.2 kg)    Health Maintenance Due  Topic Date Due  . Hepatitis C Screening  Never done  . COVID-19 Vaccine (1) Never done  . HIV Screening  Never done  . TETANUS/TDAP  Never done  . PAP SMEAR-Modifier  Never done  . INFLUENZA VACCINE  Never done    There are no preventive care reminders to display for this patient.   Lab Results  Component Value Date   TSH 4.468 01/30/2018   Lab Results  Component Value Date   WBC 6.4 08/02/2019   HGB 13.5 08/02/2019   HCT 39.9 08/02/2019   MCV 90 08/02/2019   PLT 208 08/02/2019   Lab Results  Component Value Date   NA 140 08/02/2019    K 3.9 08/02/2019   CO2 22 08/02/2019   GLUCOSE 99 08/02/2019   BUN 11 08/02/2019   CREATININE 1.15 (H) 08/02/2019  BILITOT 0.2 08/02/2019   ALKPHOS 86 08/02/2019   AST 12 08/02/2019   ALT 19 08/02/2019   PROT 6.3 08/02/2019   ALBUMIN 4.2 08/02/2019   CALCIUM 8.5 (L) 08/02/2019   ANIONGAP 9 01/30/2018   Lab Results  Component Value Date   CHOL 177 08/02/2019   Lab Results  Component Value Date   HDL 40 08/02/2019   Lab Results  Component Value Date   LDLCALC 116 (H) 08/02/2019   Lab Results  Component Value Date   TRIG 113 08/02/2019   Lab Results  Component Value Date   CHOLHDL 4.4 08/02/2019   Lab Results  Component Value Date   HGBA1C 5.3 08/02/2019       Assessment & Plan:  1. Intractable migraine with aura with status migrainosus Nurtec given. Start on calan sr 120 mg once daily  May use on imitrex 50 mg once daily at onset of aura/headache. May repeat in 2 hours.  Had to change to maxalt due to formulary. Toradol shot given today.  - ketorolac (TORADOL) injection 60 mg    Follow-up: Return in about 4 weeks (around 02/05/2020) for headaches.  An After Visit Summary was printed and given to the patient.  Blane Ohara [facility] [phone]

## 2020-01-08 ENCOUNTER — Encounter: Payer: Self-pay | Admitting: Family Medicine

## 2020-01-08 ENCOUNTER — Ambulatory Visit (INDEPENDENT_AMBULATORY_CARE_PROVIDER_SITE_OTHER): Payer: Medicare Other | Admitting: Family Medicine

## 2020-01-08 ENCOUNTER — Other Ambulatory Visit: Payer: Self-pay

## 2020-01-08 VITALS — BP 118/70 | HR 81 | Temp 97.4°F | Ht 62.0 in | Wt 168.0 lb

## 2020-01-08 DIAGNOSIS — G43111 Migraine with aura, intractable, with status migrainosus: Secondary | ICD-10-CM | POA: Diagnosis not present

## 2020-01-08 MED ORDER — SUMATRIPTAN SUCCINATE 50 MG PO TABS: ORAL_TABLET | ORAL | 1 refills | Status: DC

## 2020-01-08 MED ORDER — VERAPAMIL HCL ER 120 MG PO TBCR: 120.0000 mg | EXTENDED_RELEASE_TABLET | Freq: Every day | ORAL | 1 refills | Status: DC

## 2020-01-08 MED ORDER — KETOROLAC TROMETHAMINE 60 MG/2ML IM SOLN
60.0000 mg | Freq: Once | INTRAMUSCULAR | Status: AC
  Administered 2020-01-08: 60 mg via INTRAMUSCULAR

## 2020-01-08 NOTE — Patient Instructions (Signed)
Start on calan sr 120 mg once daily  May use on imitrex 50 mg once daily at onset of aura/headache. May repeat in 2 hours.  Toradol shot given today.

## 2020-01-09 ENCOUNTER — Other Ambulatory Visit: Payer: Self-pay | Admitting: Family Medicine

## 2020-01-09 DIAGNOSIS — G43111 Migraine with aura, intractable, with status migrainosus: Secondary | ICD-10-CM

## 2020-01-13 ENCOUNTER — Telehealth: Payer: Self-pay

## 2020-01-13 ENCOUNTER — Other Ambulatory Visit: Payer: Self-pay | Admitting: Family Medicine

## 2020-01-13 NOTE — Telephone Encounter (Signed)
Patient states that Diclofenac patches are not helping however she started Lidoderm patches are helping more so now than they did before and states she could try using it as a alternative. PT is helping with her pain also.

## 2020-01-15 ENCOUNTER — Other Ambulatory Visit: Payer: Self-pay | Admitting: Family Medicine

## 2020-01-15 MED ORDER — LIDOCAINE 4 % EX PTCH: MEDICATED_PATCH | CUTANEOUS | 3 refills | Status: AC

## 2020-01-15 NOTE — Telephone Encounter (Signed)
Sent lidocaine patches. Once daily for up to 12 hours. Must be patch free for 12 hours.

## 2020-01-16 NOTE — Telephone Encounter (Signed)
Patient informed of rx change and directions

## 2020-01-20 ENCOUNTER — Other Ambulatory Visit: Payer: Self-pay | Admitting: Family Medicine

## 2020-01-20 MED ORDER — RIZATRIPTAN BENZOATE 10 MG PO TBDP: ORAL_TABLET | ORAL | 2 refills | Status: DC

## 2020-02-04 ENCOUNTER — Ambulatory Visit: Payer: Medicare Other | Admitting: Family Medicine

## 2020-02-05 ENCOUNTER — Ambulatory Visit (INDEPENDENT_AMBULATORY_CARE_PROVIDER_SITE_OTHER): Payer: Medicare Other | Admitting: Family Medicine

## 2020-02-05 ENCOUNTER — Other Ambulatory Visit: Payer: Self-pay

## 2020-02-05 VITALS — BP 118/66 | HR 77 | Temp 94.4°F | Ht 62.0 in | Wt 173.0 lb

## 2020-02-05 DIAGNOSIS — R300 Dysuria: Secondary | ICD-10-CM

## 2020-02-05 DIAGNOSIS — N760 Acute vaginitis: Secondary | ICD-10-CM

## 2020-02-05 LAB — POCT URINALYSIS DIPSTICK
Bilirubin, UA: NEGATIVE
Blood, UA: NEGATIVE
Glucose, UA: NEGATIVE
Ketones, UA: NEGATIVE
Leukocytes, UA: NEGATIVE
Nitrite, UA: NEGATIVE
Protein, UA: NEGATIVE
Spec Grav, UA: 1.02 (ref 1.010–1.025)
Urobilinogen, UA: 0.2 E.U./dL
pH, UA: 6 (ref 5.0–8.0)

## 2020-02-05 NOTE — Progress Notes (Signed)
Acute Office Visit  Subjective:    Patient ID: Alicia Carlson, female    DOB: Jul 30, 1975, 44 y.o.   MRN: [id number]  Chief Complaint  Patient presents with  . Headache  . Urinary Tract Infection   HPI: Patient is in today for UTI symptoms. Complains of dysuria, tender to wipe. Walking and sitting "hurts kind of burns". Patient states she has had these symptoms for 4-5 days and has increased her fluid intake.   Menstrual period light in September 2021. No birth controls. Usually monthly.   Headaches have resolved per patient. No headaches since last visit.  Not sexually active.   Past Medical History:  Diagnosis Date  . Bipolar disorder with depression (HCC)   . Chronic renal failure, stage 3 (moderate) 2020  . Medical history non-contributory   . Migraine headache   . Panic attacks     Past Surgical History:  Procedure Laterality Date  . biopsy of left breast    . CESAREAN SECTION    . HERNIA REPAIR     5 weeks    Family History  Problem Relation Age of Onset  . Colon cancer Mother   . Depression Mother   . Schizophrenia Mother   . CVA Mother   . Seizures Sister   . Diabetes Father   . Liver disease Father        fatty liver   . Depression Father        anger issues  . Diabetes Brother   . Depression Brother        anger issues     Social History   Socioeconomic History  . Marital status: Married    Spouse name: Not on file  . Number of children: Not on file  . Years of education: Not on file  . Highest education level: Not on file  Occupational History  . Not on file  Tobacco Use  . Smoking status: Never Smoker  . Smokeless tobacco: Never Used  Substance and Sexual Activity  . Alcohol use: Never  . Drug use: Never  . Sexual activity: Not on file  Other Topics Concern  . Not on file  Social History Narrative  . Not on file   Social Determinants of Health   Financial Resource Strain:   . Difficulty of Paying Living Expenses: Not on file   Food Insecurity:   . Worried About Programme researcher, broadcasting/film/video in the Last Year: Not on file  . Ran Out of Food in the Last Year: Not on file  Transportation Needs:   . Lack of Transportation (Medical): Not on file  . Lack of Transportation (Non-Medical): Not on file  Physical Activity:   . Days of Exercise per Week: Not on file  . Minutes of Exercise per Session: Not on file  Stress:   . Feeling of Stress : Not on file  Social Connections:   . Frequency of Communication with Friends and Family: Not on file  . Frequency of Social Gatherings with Friends and Family: Not on file  . Attends Religious Services: Not on file  . Active Member of Clubs or Organizations: Not on file  . Attends Banker Meetings: Not on file  . Marital Status: Not on file  Intimate Partner Violence:   . Fear of Current or Ex-Partner: Not on file  . Emotionally Abused: Not on file  . Physically Abused: Not on file  . Sexually Abused: Not on file    Outpatient Medications  Prior to Visit  Medication Sig Dispense Refill  . amphetamine-dextroamphetamine (ADDERALL XR) 15 MG 24 hr capsule Take by mouth every morning.    . clonazePAM (KLONOPIN) 0.5 MG tablet Take 0.25-0.5 mg by mouth 2 (two) times daily as needed.    . diclofenac (FLECTOR) 1.3 % PTCH Place 1 patch onto the skin 2 (two) times daily. 60 patch 2  . lamoTRIgine (LAMICTAL) 150 MG tablet Take 150 mg by mouth daily.    . Lidocaine (HM LIDOCAINE PATCH) 4 % PTCH Apply one patch to lumbar back for 12 hours. Must be patch free for 12 hours. 30 patch 3  . rizatriptan (MAXALT-MLT) 10 MG disintegrating tablet Take one at onset of migraines/aura, repeat in 2 hours x 1 within 24 hours as needed. 10 tablet 2  . topiramate (TOPAMAX) 100 MG tablet Take 100 mg by mouth daily.    . verapamil (CALAN-SR) 120 MG CR tablet TAKE 1 TABLET(120 MG) BY MOUTH AT BEDTIME 90 tablet 0  . VRAYLAR capsule Take 1.5 mg by mouth daily.     No facility-administered medications  prior to visit.    No Known Allergies  Review of Systems  Constitutional: Positive for fatigue. Negative for chills.  HENT: Negative for congestion and sore throat.   Respiratory: Negative for cough.   Cardiovascular: Negative for chest pain.  Gastrointestinal: Negative for abdominal pain, constipation, diarrhea, nausea and vomiting.  Endocrine: Positive for polyuria.  Genitourinary: Positive for dysuria, frequency, menstrual problem, urgency and vaginal discharge.  Musculoskeletal: Positive for back pain.       Objective:    Physical Exam Exam conducted with a chaperone present.  Genitourinary:    General: Normal vulva.     Exam position: Lithotomy position.     Vagina: Vaginal discharge present. No erythema.     Comments: Yellow discharge. Neurological:     Mental Status: She is alert.     BP 118/66   Pulse 77   Temp (!) 94.4 F (34.7 C)   Ht 5\' 2"  (1.575 m)   Wt 173 lb (78.5 kg)   SpO2 97%   BMI 31.64 kg/m  Wt Readings from Last 3 Encounters:  02/05/20 173 lb (78.5 kg)  01/08/20 168 lb (76.2 kg)  12/27/19 169 lb 3.2 oz (76.7 kg)    Health Maintenance Due  Topic Date Due  . Hepatitis C Screening  Never done  . COVID-19 Vaccine (1) Never done  . HIV Screening  Never done  . TETANUS/TDAP  Never done  . PAP SMEAR-Modifier  Never done  . INFLUENZA VACCINE  Never done    There are no preventive care reminders to display for this patient.   Lab Results  Component Value Date   TSH 4.468 01/30/2018   Lab Results  Component Value Date   WBC 6.4 08/02/2019   HGB 13.5 08/02/2019   HCT 39.9 08/02/2019   MCV 90 08/02/2019   PLT 208 08/02/2019   Lab Results  Component Value Date   NA 140 08/02/2019   K 3.9 08/02/2019   CO2 22 08/02/2019   GLUCOSE 99 08/02/2019   BUN 11 08/02/2019   CREATININE 1.15 (H) 08/02/2019   BILITOT 0.2 08/02/2019   ALKPHOS 86 08/02/2019   AST 12 08/02/2019   ALT 19 08/02/2019   PROT 6.3 08/02/2019   ALBUMIN 4.2 08/02/2019    CALCIUM 8.5 (L) 08/02/2019   ANIONGAP 9 01/30/2018   Lab Results  Component Value Date   CHOL 177 08/02/2019  Lab Results  Component Value Date   HDL 40 08/02/2019   Lab Results  Component Value Date   LDLCALC 116 (H) 08/02/2019   Lab Results  Component Value Date   TRIG 113 08/02/2019   Lab Results  Component Value Date   CHOLHDL 4.4 08/02/2019   Lab Results  Component Value Date   HGBA1C 5.3 08/02/2019       Assessment & Plan:  1. Dysuria - POCT urinalysis dipstick normal  2. Acute vaginitis. - KOH/WET normal. - Recommend vagisil    Orders Placed This Encounter  Procedures  . POCT urinalysis dipstick     Follow-up: No follow-ups on file.  An After Visit Summary was printed and given to the patient.  Eugenie Norrie [facility] [phone]

## 2020-02-12 ENCOUNTER — Ambulatory Visit: Payer: Medicare Other | Admitting: Family Medicine

## 2020-02-13 ENCOUNTER — Encounter: Payer: Self-pay | Admitting: Family Medicine

## 2020-02-13 LAB — POCT WET + KOH PREP
Trich by wet prep: ABSENT
Yeast by KOH: ABSENT

## 2020-02-24 ENCOUNTER — Encounter: Payer: Self-pay | Admitting: Family Medicine

## 2020-02-24 ENCOUNTER — Other Ambulatory Visit: Payer: Self-pay

## 2020-02-24 ENCOUNTER — Ambulatory Visit (INDEPENDENT_AMBULATORY_CARE_PROVIDER_SITE_OTHER): Payer: Medicare Other | Admitting: Family Medicine

## 2020-02-24 VITALS — BP 122/82 | HR 77 | Temp 96.4°F | Ht 62.0 in | Wt 179.0 lb

## 2020-02-24 DIAGNOSIS — M7062 Trochanteric bursitis, left hip: Secondary | ICD-10-CM | POA: Diagnosis not present

## 2020-02-24 DIAGNOSIS — F3112 Bipolar disorder, current episode manic without psychotic features, moderate: Secondary | ICD-10-CM

## 2020-02-24 DIAGNOSIS — M25552 Pain in left hip: Secondary | ICD-10-CM | POA: Diagnosis not present

## 2020-02-24 DIAGNOSIS — M25551 Pain in right hip: Secondary | ICD-10-CM

## 2020-02-24 MED ORDER — TOPIRAMATE 100 MG PO TABS: 100.0000 mg | ORAL_TABLET | Freq: Every day | ORAL | 2 refills | Status: DC

## 2020-02-24 MED ORDER — ZIPRASIDONE HCL 40 MG PO CAPS: 40.0000 mg | ORAL_CAPSULE | Freq: Two times a day (BID) | ORAL | 0 refills | Status: DC

## 2020-02-24 MED ORDER — AMPHETAMINE-DEXTROAMPHET ER 15 MG PO CP24: 15.0000 mg | ORAL_CAPSULE | Freq: Every morning | ORAL | 0 refills | Status: AC

## 2020-02-24 MED ORDER — LAMOTRIGINE 200 MG PO TABS: 200.0000 mg | ORAL_TABLET | Freq: Every day | ORAL | 2 refills | Status: DC

## 2020-02-24 NOTE — Progress Notes (Signed)
Subjective:  Patient ID: Alicia Carlson, female    DOB: [birth year]  Age: 44 y.o. MRN: [id number]  Chief Complaint  Patient presents with  . Hip Pain  . Mental Health Problem    HPI  Patient presents for BL hip pain states that her PT suggested she have a xray to look for arthritis.   Patient is requesting assistance with a psychiatric referral or bridging the gap between when she gets her new psychiatrist at her previous office.  Her current psychiatrist moved to [place].  Patient had been well controlled on Vraylar except as she was having some clenching of her jaw and TMJ issues in the evening.  This is happened previously with Latuda and Abilify I believe.  She discontinued the vraylar and it resolved. She is requesting to try Geodon.  Apparently her previous psychiatrist had done a DNA test to determine which medication she responded the best to.  She denies depression.  I did have her do a mood disorder questionnaire and she does appear to be hypomanic.  In fact I, do not believe I see her this happy previously.  The patient does not feel she is having a manic episode currently but she is concerned that she is heading that direction. Her new psychiatrist taking over for her psychiatrist who moved, would not fill any of her medicines until he saw her and this was not going to be until January 2022.  Current Outpatient Medications on File Prior to Visit  Medication Sig Dispense Refill  . clonazePAM (KLONOPIN) 0.5 MG tablet Take 0.25-0.5 mg by mouth 2 (two) times daily as needed.    . Lidocaine (HM LIDOCAINE PATCH) 4 % PTCH Apply one patch to lumbar back for 12 hours. Must be patch free for 12 hours. 30 patch 3  . rizatriptan (MAXALT-MLT) 10 MG disintegrating tablet Take one at onset of migraines/aura, repeat in 2 hours x 1 within 24 hours as needed. 10 tablet 2  . verapamil (CALAN-SR) 120 MG CR tablet TAKE 1 TABLET(120 MG) BY MOUTH AT BEDTIME 90 tablet 0   No current facility-administered  medications on file prior to visit.   Past Medical History:  Diagnosis Date  . Bipolar disorder with depression (HCC)   . Chronic renal failure, stage 3 (moderate) (HCC) 2020  . Medical history non-contributory   . Migraine headache   . Panic attacks    Past Surgical History:  Procedure Laterality Date  . biopsy of left breast    . CESAREAN SECTION    . HERNIA REPAIR     5 weeks    Family History  Problem Relation Age of Onset  . Colon cancer Mother   . Depression Mother   . Schizophrenia Mother   . CVA Mother   . Seizures Sister   . Diabetes Father   . Liver disease Father        fatty liver   . Depression Father        anger issues  . Diabetes Brother   . Depression Brother        anger issues    Social History   Socioeconomic History  . Marital status: Married    Spouse name: Not on file  . Number of children: Not on file  . Years of education: Not on file  . Highest education level: Not on file  Occupational History  . Not on file  Tobacco Use  . Smoking status: Never Smoker  . Smokeless tobacco: Never Used  Substance and Sexual Activity  . Alcohol use: Never  . Drug use: Never  . Sexual activity: Not on file  Other Topics Concern  . Not on file  Social History Narrative  . Not on file   Social Determinants of Health   Financial Resource Strain:   . Difficulty of Paying Living Expenses: Not on file  Food Insecurity:   . Worried About Programme researcher, broadcasting/film/video in the Last Year: Not on file  . Ran Out of Food in the Last Year: Not on file  Transportation Needs:   . Lack of Transportation (Medical): Not on file  . Lack of Transportation (Non-Medical): Not on file  Physical Activity:   . Days of Exercise per Week: Not on file  . Minutes of Exercise per Session: Not on file  Stress:   . Feeling of Stress : Not on file  Social Connections:   . Frequency of Communication with Friends and Family: Not on file  . Frequency of Social Gatherings with Friends  and Family: Not on file  . Attends Religious Services: Not on file  . Active Member of Clubs or Organizations: Not on file  . Attends Banker Meetings: Not on file  . Marital Status: Not on file    Review of Systems  Constitutional: Negative for chills, fatigue and fever.  HENT: Negative for congestion, ear pain, postnasal drip, rhinorrhea, sinus pressure, sinus pain and sore throat.   Respiratory: Negative for cough and shortness of breath.   Cardiovascular: Negative for chest pain.  Gastrointestinal: Negative for diarrhea and nausea.  Musculoskeletal: Positive for arthralgias (BL hips).   Objective:  BP 122/82   Pulse 77   Temp (!) 96.4 F (35.8 C)   Ht 5\' 2"  (1.575 m)   Wt 179 lb (81.2 kg)   SpO2 96%   BMI 32.74 kg/m   BP/Weight 02/24/2020 02/05/2020 01/08/2020  Systolic BP 122 118 118  Diastolic BP 82 66 70  Wt. (Lbs) 179 173 168  BMI 32.74 31.64 30.73  Some encounter information is confidential and restricted. Go to Review Flowsheets activity to see all data.    Physical Exam Constitutional:      Appearance: Normal appearance.  Cardiovascular:     Rate and Rhythm: Normal rate and regular rhythm.     Pulses: Normal pulses.     Heart sounds: Normal heart sounds.  Pulmonary:     Effort: Pulmonary effort is normal.     Breath sounds: Normal breath sounds.  Abdominal:     General: Bowel sounds are normal.     Palpations: Abdomen is soft.     Tenderness: There is no abdominal tenderness.  Musculoskeletal:        General: Tenderness (rt trochanter bursa. ) present.     Comments: Full ROM of BL hips. Some discomfort with flexion.  Neurological:     Mental Status: She is alert.   Patient has slightly increased speech speed.  Smiling.  Diabetic Foot Exam - Simple   No data filed       Lab Results  Component Value Date   WBC 6.4 08/02/2019   HGB 13.5 08/02/2019   HCT 39.9 08/02/2019   PLT 208 08/02/2019   GLUCOSE 99 08/02/2019   CHOL 177  08/02/2019   TRIG 113 08/02/2019   HDL 40 08/02/2019   LDLCALC 116 (H) 08/02/2019   ALT 19 08/02/2019   AST 12 08/02/2019   NA 140 08/02/2019   K 3.9 08/02/2019  CL 103 08/02/2019   CREATININE 1.15 (H) 08/02/2019   BUN 11 08/02/2019   CO2 22 08/02/2019   TSH 4.468 01/30/2018   HGBA1C 5.3 08/02/2019      Assessment & Plan:   1. Greater trochanteric bursitis of left hip Risks were discussed including bleeding, infection, increase in sugars if diabetic, atrophy at site of injection, and increased pain.  After consent was obtained, using sterile technique the left trochanteric bursa was prepped with alcohol.  Kenalog 80 mg and 5 ml plain Lidocaine was then injected and the needle withdrawn.  The procedure was well tolerated.   The patient is asked to continue to rest the joint for a few more days before resuming regular activities.  It may be more painful for the first 1-2 days.  Watch for fever, or increased swelling or persistent pain in the joint. Call or return to clinic prn if such symptoms occur or there is failure to improve as anticipated. - triamcinolone acetonide (KENALOG-40) injection 40 mg  2. Left hip pain - DG HIPS BILAT WITH PELVIS MIN 5 VIEWS  3. Right hip pain - DG HIPS BILAT WITH PELVIS MIN 5 VIEWS  4. Bipolar disorder, manic, moderate (HCC) Geodon 40 mg one twice a day. Take with food. Increase lamictal to 200 mg daily.  Refilled topamax and adderal xr.  Contracted for safety.  Pt to call psychiatry back and get an appt at their earliest available appt which per pt is not until January 2022. - ziprasidone (GEODON) 40 MG capsule; Take 1 capsule (40 mg total) by mouth 2 (two) times daily with a meal.  Dispense: 60 capsule; Refill: 0 - lamoTRIgine (LAMICTAL) 200 MG tablet; Take 1 tablet (200 mg total) by mouth daily.  Dispense: 30 tablet; Refill: 2 - topiramate (TOPAMAX) 100 MG tablet; Take 1 tablet (100 mg total) by mouth daily.  Dispense: 30 tablet; Refill: 2 -  amphetamine-dextroamphetamine (ADDERALL XR) 15 MG 24 hr capsule; Take 1 capsule by mouth every morning.  Dispense: 30 capsule; Refill: 0    Meds ordered this encounter  Medications  . ziprasidone (GEODON) 40 MG capsule    Sig: Take 1 capsule (40 mg total) by mouth 2 (two) times daily with a meal.    Dispense:  60 capsule    Refill:  0  . lamoTRIgine (LAMICTAL) 200 MG tablet    Sig: Take 1 tablet (200 mg total) by mouth daily.    Dispense:  30 tablet    Refill:  2  . topiramate (TOPAMAX) 100 MG tablet    Sig: Take 1 tablet (100 mg total) by mouth daily.    Dispense:  30 tablet    Refill:  2  . amphetamine-dextroamphetamine (ADDERALL XR) 15 MG 24 hr capsule    Sig: Take 1 capsule by mouth every morning.    Dispense:  30 capsule    Refill:  0  . triamcinolone acetonide (KENALOG-40) injection 40 mg    Orders Placed This Encounter  Procedures  . DG HIPS BILAT WITH PELVIS MIN 5 VIEWS     I spent 30 minutes dedicated to the care of this patient on the date of this encounter to include face-to-face time with the patient, as well as: Preparing to see the patient (review of last medication changes.). Obtaining and/or reviewing separately obtained history. Performing a medically appropriate examination and/or evaluation. Counseling and educating the patient on use of latuda. Ordering medications. Performing procedures.  Documenting clinical information in the electronic or other  health record.  My nursing staff have aided in the documentation of this note on the behalf of Blane Ohara, MD,as directed by  Blane Ohara, MD and thoroughly reviewed by Blane Ohara, MD.  Follow-up: Return in about 4 weeks (around 03/23/2020).  An After Visit Summary was printed and given to the patient.  Blane Ohara, MD [facility] [phone]

## 2020-02-24 NOTE — Patient Instructions (Signed)
Geodon 40 mg one twice a day. Take with food. Increase lamictal to 200 mg daily.  Refilled topamax and adderal xr.

## 2020-02-25 MED ORDER — TRIAMCINOLONE ACETONIDE 40 MG/ML IJ SUSP: 40.0000 mg | Freq: Once | INTRAMUSCULAR | Status: AC

## 2020-02-27 IMAGING — US US RENAL
1 series · 14 of 25 positions shown · non-contrast
Comparison: None.

CLINICAL DATA: 43-year-old female with chronic kidney disease.

EXAM:
RENAL / URINARY TRACT ULTRASOUND COMPLETE

[Series 1: us renal · 0.23mm/px · 14 of 34 slices shown]
[im 1/34]
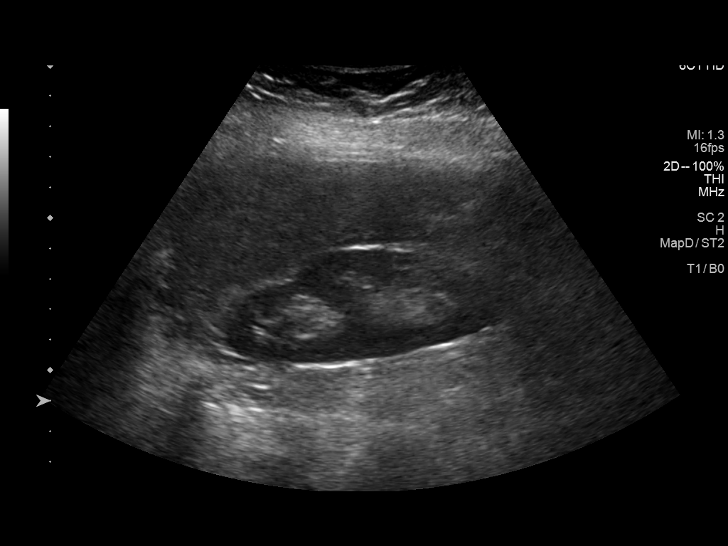
[im 3/34]
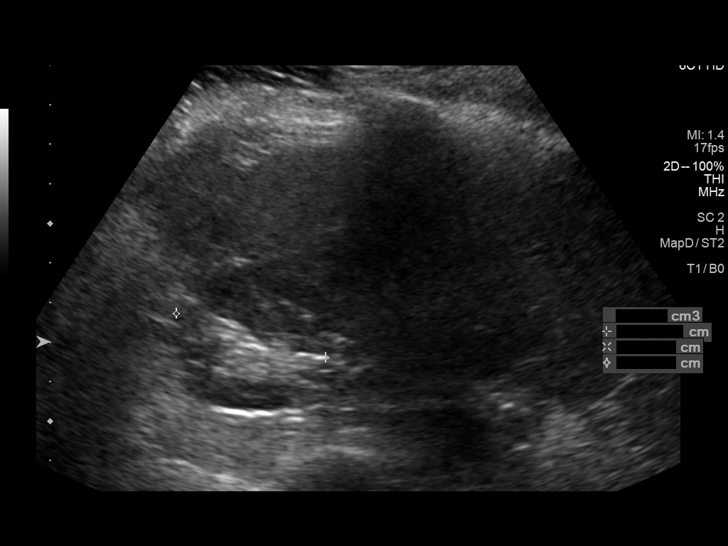
[im 6/34]
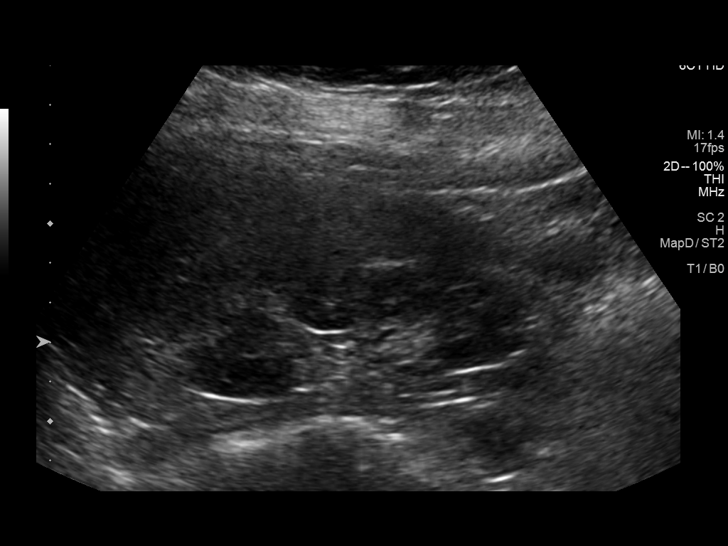
[im 9/34]
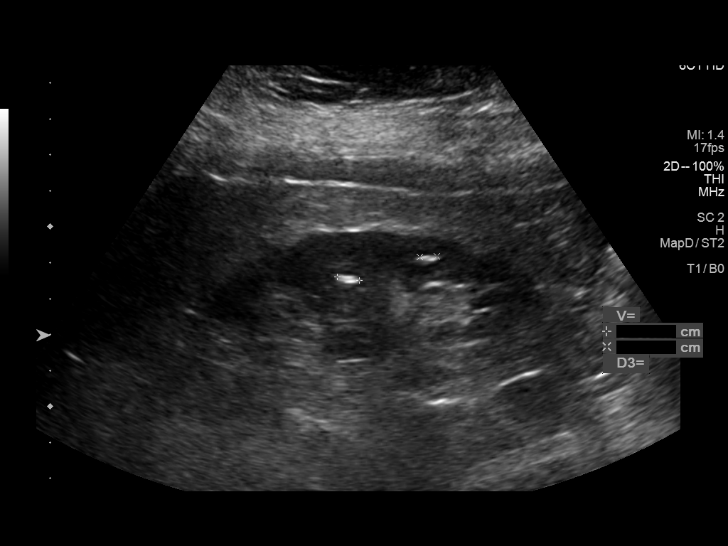
[im 12/34]
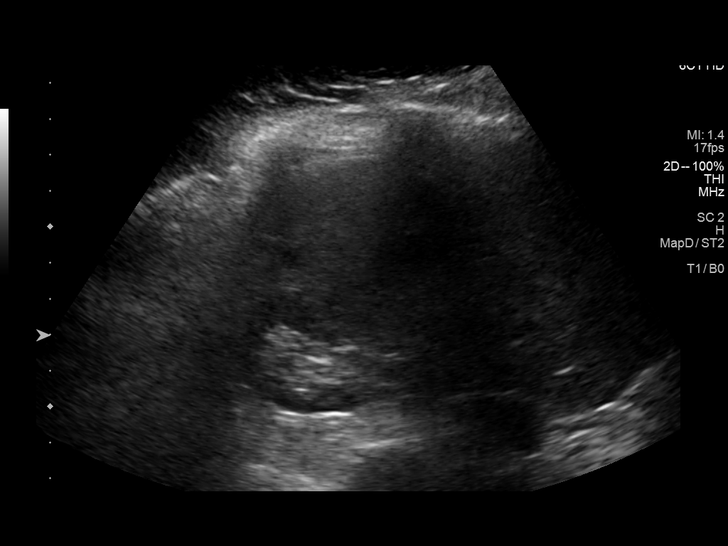
[im 13/34]
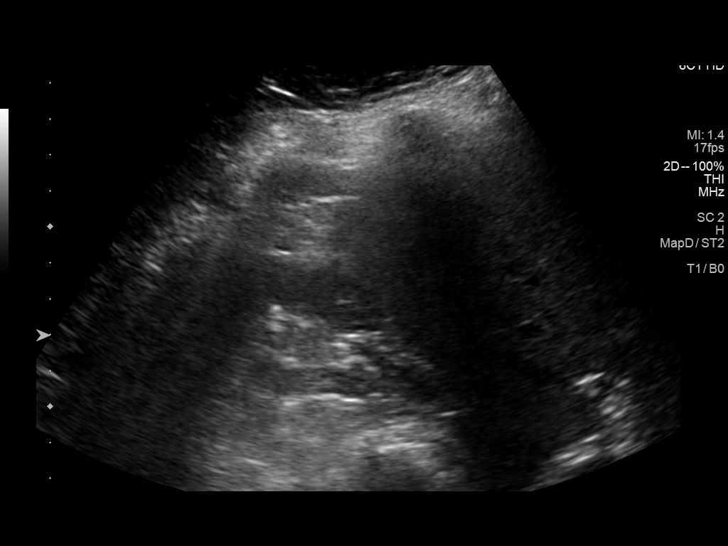
[im 16/34]
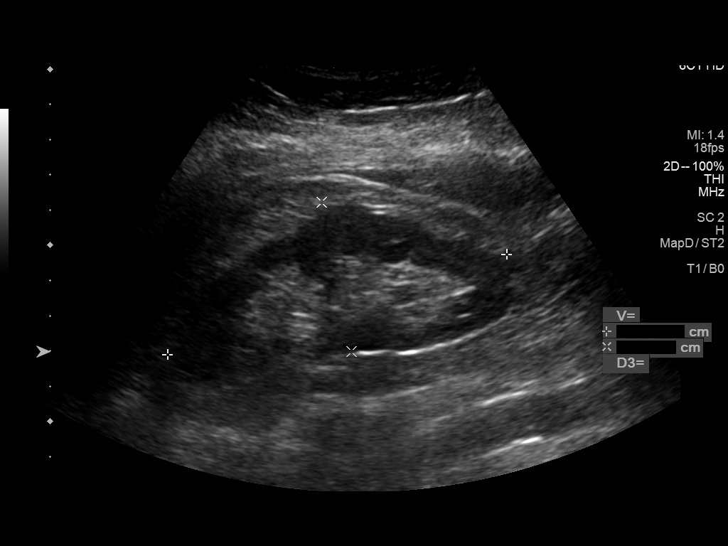
[im 18/34]
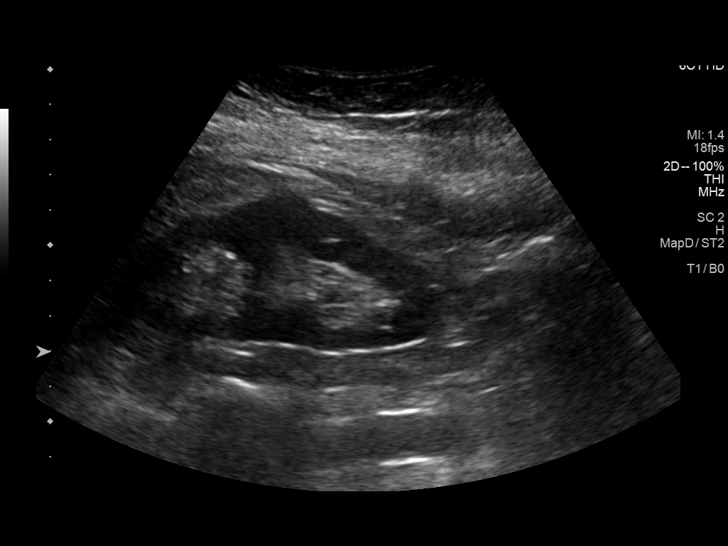
[im 21/34]
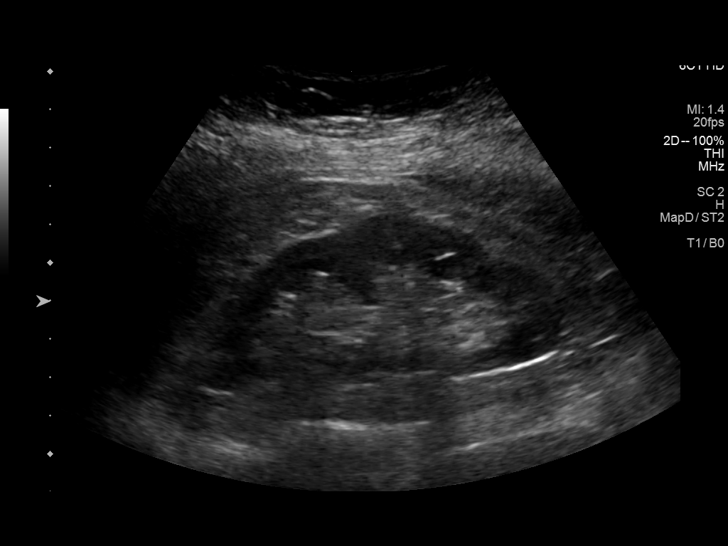
[im 23/34]
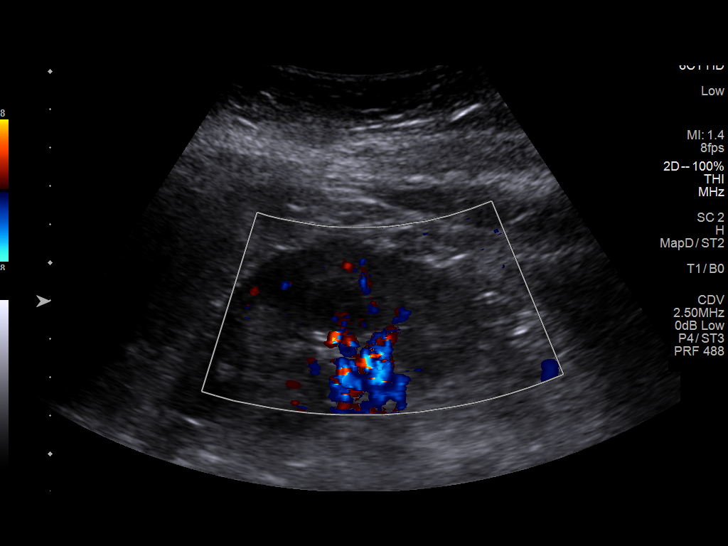
[im 25/34]
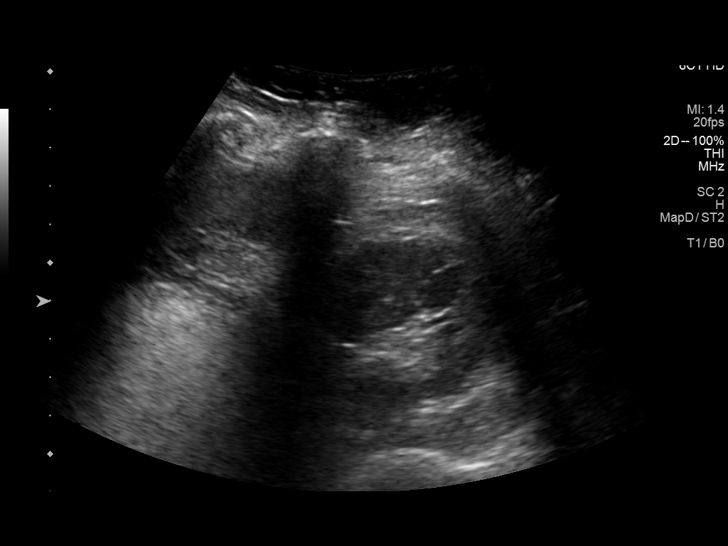
[im 28/34]
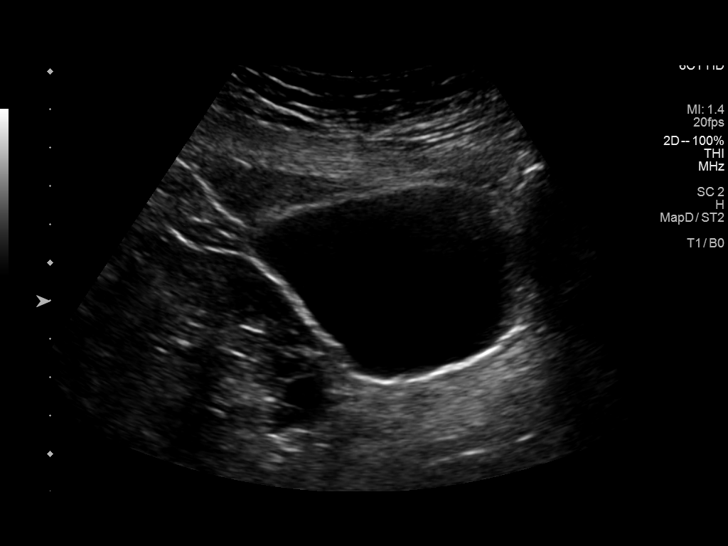
[im 31/34]
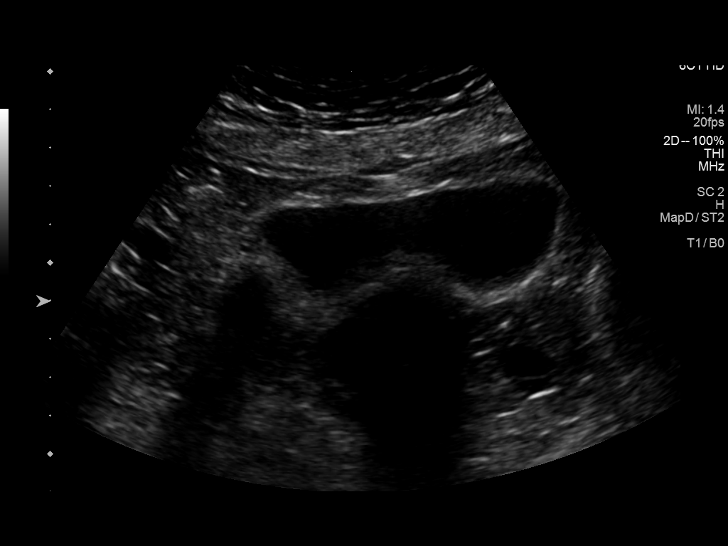
[im 34/34]
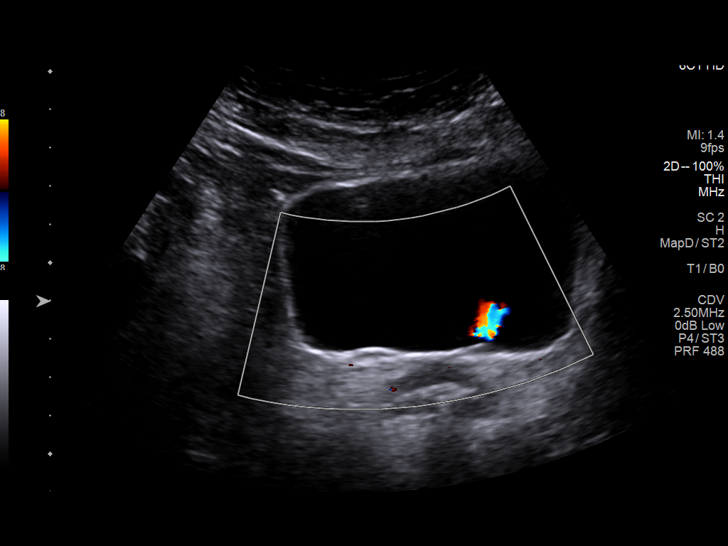

[14 of 25 positions shown; findings below may reference images not displayed]

FINDINGS: Right Kidney:

Renal measurements: 10.5 x 3.8 x 3.9 cm = volume: 82 mL. Mild
parenchyma atrophy. Normal echogenicity. Two nonshadowing echogenic
foci of measure up to 6 mm noted which may represent foci of
parenchymal calcification or nonobstructing calculi. No
hydronephrosis.

Left Kidney:

Renal measurements: 10.0 x 4.3 x 4.7 cm = volume: 108 mL. Mild
parenchyma atrophy. Normal echogenicity. No hydronephrosis. Small
linear echogenic foci likely vascular calcification.

Bladder:

Appears normal for degree of bladder distention. The left ureteral
jet is visualized.

Other:

Probable fatty infiltration of the liver.
IMPRESSION: 1. Mild bilateral renal parenchyma atrophy.  No hydronephrosis.
2. Small linear echogenic foci may represent parenchymal
calcification or vascular calcification versus less likely
nonobstructing stones.

## 2020-03-19 ENCOUNTER — Ambulatory Visit (INDEPENDENT_AMBULATORY_CARE_PROVIDER_SITE_OTHER): Payer: Medicare Other | Admitting: Family Medicine

## 2020-03-19 ENCOUNTER — Ambulatory Visit (INDEPENDENT_AMBULATORY_CARE_PROVIDER_SITE_OTHER): Payer: Medicare Other

## 2020-03-19 ENCOUNTER — Other Ambulatory Visit: Payer: Self-pay

## 2020-03-19 ENCOUNTER — Encounter: Payer: Self-pay | Admitting: Family Medicine

## 2020-03-19 VITALS — BP 110/70 | HR 88 | Temp 97.3°F | Resp 18 | Ht 62.0 in | Wt 180.0 lb

## 2020-03-19 DIAGNOSIS — F3132 Bipolar disorder, current episode depressed, moderate: Secondary | ICD-10-CM | POA: Diagnosis not present

## 2020-03-19 DIAGNOSIS — F902 Attention-deficit hyperactivity disorder, combined type: Secondary | ICD-10-CM | POA: Diagnosis not present

## 2020-03-19 DIAGNOSIS — M542 Cervicalgia: Secondary | ICD-10-CM | POA: Diagnosis not present

## 2020-03-19 DIAGNOSIS — Z23 Encounter for immunization: Secondary | ICD-10-CM

## 2020-03-19 DIAGNOSIS — M722 Plantar fascial fibromatosis: Secondary | ICD-10-CM

## 2020-03-19 DIAGNOSIS — G43109 Migraine with aura, not intractable, without status migrainosus: Secondary | ICD-10-CM

## 2020-03-19 MED ORDER — LAMOTRIGINE 150 MG PO TABS: 300.0000 mg | ORAL_TABLET | Freq: Every day | ORAL | 0 refills | Status: AC

## 2020-03-19 MED ORDER — ZIPRASIDONE HCL 80 MG PO CAPS: 80.0000 mg | ORAL_CAPSULE | Freq: Every day | ORAL | 2 refills | Status: DC

## 2020-03-19 NOTE — Progress Notes (Signed)
   Covid-19 Vaccination Clinic  Name:  Alicia Carlson    MRN: [id number] DOB: 01-26-1976  03/19/2020  [name] was observed post Covid-19 immunization for 15 minutes without incident. She was provided with Vaccine Information Sheet and instruction to access the V-Safe system.   [name] was instructed to call 911 with any severe reactions post vaccine: Marland Kitchen Difficulty breathing  . Swelling of face and throat  . A fast heartbeat  . A bad rash all over body  . Dizziness and weakness   Immunizations Administered    Name Date Dose VIS Date Route   Pfizer COVID-19 Vaccine 03/19/2020 10:45 AM 0.3 mL 01/29/2020 Intramuscular   Manufacturer: [employer], [employer]   Lot: [id number]   NDC: [address]-9

## 2020-03-19 NOTE — Progress Notes (Signed)
Acute Office Visit  Subjective:    Patient ID: Alicia Carlson, female    DOB: 09-Jun-1975, 44 y.o.   MRN: [id number]  Chief Complaint  Patient presents with  . Neck Pain    HPI Patient is in today to get a new referral for physical therapy for right plantar fasciitis and left sided neck pain. She was seeing Mehesh at [facility] in [place] for low back pain. Using tylenol.   Patient has bipolar disorder with depression. Was taking geodon 40 mg once daily. Was not helping, so pt discontinued it. Restarted lexapro 10 mg once daily. Taking lamictal 150 mg two twice a day.   Pt continues to have headaches (chronic daily.) Occasionally has migraines. ON calan sr 120 mg once daily and topamax 100 mg once daily.   ADD - adderall xr helps with attention.   Past Medical History:  Diagnosis Date  . Bipolar disorder with depression (HCC)   . Chronic renal failure, stage 3 (moderate) (HCC) 2020  . Medical history non-contributory   . Migraine headache   . Panic attacks     Past Surgical History:  Procedure Laterality Date  . biopsy of left breast    . CESAREAN SECTION    . HERNIA REPAIR     5 weeks    Family History  Problem Relation Age of Onset  . Colon cancer Mother   . Depression Mother   . Schizophrenia Mother   . CVA Mother   . Seizures Sister   . Diabetes Father   . Liver disease Father        fatty liver   . Depression Father        anger issues  . Diabetes Brother   . Depression Brother        anger issues     Social History   Socioeconomic History  . Marital status: Married    Spouse name: Not on file  . Number of children: Not on file  . Years of education: Not on file  . Highest education level: Not on file  Occupational History  . Not on file  Tobacco Use  . Smoking status: Never Smoker  . Smokeless tobacco: Never Used  Substance and Sexual Activity  . Alcohol use: Never  . Drug use: Never  . Sexual activity: Not on file  Other Topics Concern   . Not on file  Social History Narrative  . Not on file   Social Determinants of Health   Financial Resource Strain: Not on file  Food Insecurity: Not on file  Transportation Needs: Not on file  Physical Activity: Not on file  Stress: Not on file  Social Connections: Not on file  Intimate Partner Violence: Not on file    Outpatient Medications Prior to Visit  Medication Sig Dispense Refill  . amphetamine-dextroamphetamine (ADDERALL XR) 15 MG 24 hr capsule Take 1 capsule by mouth every morning. 30 capsule 0  . clonazePAM (KLONOPIN) 0.5 MG tablet Take 0.25-0.5 mg by mouth 2 (two) times daily as needed.    . Lidocaine (HM LIDOCAINE PATCH) 4 % PTCH Apply one patch to lumbar back for 12 hours. Must be patch free for 12 hours. 30 patch 3  . rizatriptan (MAXALT-MLT) 10 MG disintegrating tablet Take one at onset of migraines/aura, repeat in 2 hours x 1 within 24 hours as needed. 10 tablet 2  . topiramate (TOPAMAX) 100 MG tablet Take 1 tablet (100 mg total) by mouth daily. 30 tablet 2  .  verapamil (CALAN-SR) 120 MG CR tablet TAKE 1 TABLET(120 MG) BY MOUTH AT BEDTIME 90 tablet 0  . lamoTRIgine (LAMICTAL) 150 MG tablet Take 300 mg by mouth daily.    Marland Kitchen lamoTRIgine (LAMICTAL) 200 MG tablet Take 1 tablet (200 mg total) by mouth daily. (Patient not taking: Reported on 03/19/2020) 30 tablet 2  . ziprasidone (GEODON) 40 MG capsule Take 1 capsule (40 mg total) by mouth 2 (two) times daily with a meal. 60 capsule 0   Facility-Administered Medications Prior to Visit  Medication Dose Route Frequency Provider Last Rate Last Admin  . triamcinolone acetonide (KENALOG-40) injection 40 mg  40 mg Intra-articular Once Joene Gelder, MD        No Known Allergies  Review of Systems  Constitutional: Positive for fatigue. Negative for chills and fever.  HENT: Positive for ear pain. Negative for congestion and sore throat.   Respiratory: Negative for cough and shortness of breath.   Cardiovascular: Negative for  chest pain.  Gastrointestinal: Positive for nausea (may be stress. Zofran helps). Negative for abdominal pain, constipation, diarrhea and vomiting.  Genitourinary: Positive for frequency. Negative for dysuria and urgency.       Stress incontinence.  Overactive bladder.   Musculoskeletal: Positive for back pain. Negative for arthralgias and myalgias.  Skin: Negative for rash.  Neurological: Positive for headaches. Negative for dizziness.  Psychiatric/Behavioral: Negative for dysphoric mood. The patient is not nervous/anxious.        Objective:    Physical Exam Vitals reviewed.  Constitutional:      Appearance: Normal appearance. She is obese.  HENT:     Right Ear: Tympanic membrane, ear canal and external ear normal.     Left Ear: Tympanic membrane, ear canal and external ear normal.     Nose: Nose normal.     Mouth/Throat:     Pharynx: Oropharynx is clear.  Cardiovascular:     Rate and Rhythm: Normal rate and regular rhythm.     Heart sounds: Normal heart sounds. No murmur heard.   Pulmonary:     Effort: Pulmonary effort is normal. No respiratory distress.     Breath sounds: Normal breath sounds.  Abdominal:     General: Abdomen is flat. Bowel sounds are normal.     Palpations: Abdomen is soft.     Tenderness: There is no abdominal tenderness.  Musculoskeletal:        General: Tenderness (left cervical paraspinal muscles. right foot tender over plantar fascia insertion and in arch of foot. ) present.  Neurological:     Mental Status: She is alert and oriented to person, place, and time.  Psychiatric:        Mood and Affect: Mood normal.        Behavior: Behavior normal.     BP 110/70   Pulse 88   Temp (!) 97.3 F (36.3 C)   Resp 18   Ht 5\' 2"  (1.575 m)   Wt 180 lb (81.6 kg)   BMI 32.92 kg/m  Wt Readings from Last 3 Encounters:  03/19/20 180 lb (81.6 kg)  02/24/20 179 lb (81.2 kg)  02/05/20 173 lb (78.5 kg)    Health Maintenance Due  Topic Date Due  .  Hepatitis C Screening  Never done  . HIV Screening  Never done  . TETANUS/TDAP  Never done  . PAP SMEAR-Modifier  Never done  . INFLUENZA VACCINE  Never done    There are no preventive care reminders to display for this  patient.   Lab Results  Component Value Date   TSH 4.468 01/30/2018   Lab Results  Component Value Date   WBC 6.4 08/02/2019   HGB 13.5 08/02/2019   HCT 39.9 08/02/2019   MCV 90 08/02/2019   PLT 208 08/02/2019   Lab Results  Component Value Date   NA 140 08/02/2019   K 3.9 08/02/2019   CO2 22 08/02/2019   GLUCOSE 99 08/02/2019   BUN 11 08/02/2019   CREATININE 1.15 (H) 08/02/2019   BILITOT 0.2 08/02/2019   ALKPHOS 86 08/02/2019   AST 12 08/02/2019   ALT 19 08/02/2019   PROT 6.3 08/02/2019   ALBUMIN 4.2 08/02/2019   CALCIUM 8.5 (L) 08/02/2019   ANIONGAP 9 01/30/2018   Lab Results  Component Value Date   CHOL 177 08/02/2019   Lab Results  Component Value Date   HDL 40 08/02/2019   Lab Results  Component Value Date   LDLCALC 116 (H) 08/02/2019   Lab Results  Component Value Date   TRIG 113 08/02/2019   Lab Results  Component Value Date   CHOLHDL 4.4 08/02/2019   Lab Results  Component Value Date   HGBA1C 5.3 08/02/2019       Assessment & Plan:  1. Plantar fasciitis of right foot - Ambulatory referral to Physical Therapy  2. Neck pain on left side - Ambulatory referral of Physical Therapy  3. Bipolar disorder with moderate depression (HCC) Start on geodon 40 mg once daily x 1 week, then increase to 80 mg once daily with meals. Continue lamictal at 300 mg once daily at night.  - ziprasidone (GEODON) 80 MG capsule; Take 1 capsule (80 mg total) by mouth daily.  Dispense: 30 capsule; Refill: 2 - lamoTRIgine (LAMICTAL) 150 MG tablet; Take 2 tablets (300 mg total) by mouth at bedtime.  Dispense: 180 tablet; Refill: 0  4. Attention deficit hyperactivity disorder (ADHD), combined type The current medical regimen is effective;  continue  present plan and medications.  5. Migraine aura without headache  The current medical regimen is effective;  continue present plan and medications.   Meds ordered this encounter  Medications  . ziprasidone (GEODON) 80 MG capsule    Sig: Take 1 capsule (80 mg total) by mouth daily.    Dispense:  30 capsule    Refill:  2  . lamoTRIgine (LAMICTAL) 150 MG tablet    Sig: Take 2 tablets (300 mg total) by mouth at bedtime.    Dispense:  180 tablet    Refill:  0    Orders Placed This Encounter  Procedures  . Ambulatory referral to Physical Therapy     I spent 30 minutes dedicated to the care of this patient on the date of this encounter to include face-to-face time with the patient, as well as: reviewing chart. Discussed importance of seeing psychiatry.  Follow-up: Return in about 3 months (around 06/17/2020) for fasting.  An After Visit Summary was printed and given to the patient.  Blane Ohara, MD [facility] [phone]

## 2020-03-19 NOTE — Patient Instructions (Addendum)
Bipolar: Continue Lamictal 150 mg to twice daily. Discontinue Lexapro 10 mg once daily. Restart Geodon 40 mg once daily at suppertime x1 week, then increase to 80 mg daily at suppertime. If not helping in 3-4 weeks, may stop and restart lexapro.  Referral to physical therapy for neck pain and right foot pain  Headaches likely related to tension in her neck.  Hopefully physical therapy will help.

## 2020-03-27 ENCOUNTER — Ambulatory Visit: Payer: Medicare Other | Admitting: Family Medicine

## 2020-04-07 ENCOUNTER — Other Ambulatory Visit: Payer: Self-pay | Admitting: Family Medicine

## 2020-04-07 ENCOUNTER — Other Ambulatory Visit: Payer: Self-pay

## 2020-04-07 MED ORDER — CLONAZEPAM 0.5 MG PO TABS: 0.5000 mg | ORAL_TABLET | Freq: Two times a day (BID) | ORAL | 1 refills | Status: AC | PRN

## 2020-04-22 ENCOUNTER — Ambulatory Visit (INDEPENDENT_AMBULATORY_CARE_PROVIDER_SITE_OTHER): Payer: Medicare Other | Admitting: Family Medicine

## 2020-04-22 ENCOUNTER — Encounter: Payer: Self-pay | Admitting: Family Medicine

## 2020-04-22 VITALS — BP 112/76 | HR 75

## 2020-04-22 DIAGNOSIS — M79622 Pain in left upper arm: Secondary | ICD-10-CM

## 2020-04-22 DIAGNOSIS — Z23 Encounter for immunization: Secondary | ICD-10-CM | POA: Diagnosis not present
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
## 2020-05-18 ENCOUNTER — Other Ambulatory Visit: Payer: Self-pay

## 2020-05-18 DIAGNOSIS — F3112 Bipolar disorder, current episode manic without psychotic features, moderate: Secondary | ICD-10-CM

## 2020-05-18 DIAGNOSIS — G43111 Migraine with aura, intractable, with status migrainosus: Secondary | ICD-10-CM

## 2020-05-18 MED ORDER — VERAPAMIL HCL ER 120 MG PO TBCR: EXTENDED_RELEASE_TABLET | ORAL | 0 refills | Status: DC

## 2020-05-18 MED ORDER — TOPIRAMATE 100 MG PO TABS: 100.0000 mg | ORAL_TABLET | Freq: Every day | ORAL | 0 refills | Status: AC

## 2020-06-03 LAB — HM COLONOSCOPY

## 2020-06-18 NOTE — Progress Notes (Signed)
Cancelled. kc 

## 2020-06-19 ENCOUNTER — Ambulatory Visit (INDEPENDENT_AMBULATORY_CARE_PROVIDER_SITE_OTHER): Payer: Medicaid Other | Admitting: Family Medicine

## 2020-06-19 ENCOUNTER — Other Ambulatory Visit: Payer: Self-pay

## 2020-06-19 DIAGNOSIS — F902 Attention-deficit hyperactivity disorder, combined type: Secondary | ICD-10-CM

## 2020-06-19 DIAGNOSIS — E782 Mixed hyperlipidemia: Secondary | ICD-10-CM

## 2020-06-19 DIAGNOSIS — F3132 Bipolar disorder, current episode depressed, moderate: Secondary | ICD-10-CM

## 2020-06-25 LAB — HM MAMMOGRAPHY

## 2020-07-07 ENCOUNTER — Other Ambulatory Visit: Payer: Self-pay | Admitting: Family Medicine

## 2020-07-07 DIAGNOSIS — F3132 Bipolar disorder, current episode depressed, moderate: Secondary | ICD-10-CM

## 2020-08-23 ENCOUNTER — Other Ambulatory Visit: Payer: Self-pay | Admitting: Family Medicine

## 2020-08-23 DIAGNOSIS — G43111 Migraine with aura, intractable, with status migrainosus: Secondary | ICD-10-CM

## 2021-01-26 ENCOUNTER — Other Ambulatory Visit: Payer: Self-pay | Admitting: Family Medicine

## 2021-03-29 ENCOUNTER — Other Ambulatory Visit: Payer: Self-pay | Admitting: Family Medicine

## 2021-03-29 DIAGNOSIS — F3112 Bipolar disorder, current episode manic without psychotic features, moderate: Secondary | ICD-10-CM

## 2022-02-03 ENCOUNTER — Encounter (HOSPITAL_COMMUNITY): Payer: Self-pay

## 2022-02-03 ENCOUNTER — Emergency Department (HOSPITAL_COMMUNITY)
Admission: EM | Admit: 2022-02-03 | Discharge: 2022-02-03 | Disposition: A | Discharge status: Home / Self Care | Payer: 59 | Attending: Emergency Medicine | Admitting: Emergency Medicine

## 2022-02-03 ENCOUNTER — Emergency Department (HOSPITAL_COMMUNITY): Payer: 59

## 2022-02-03 DIAGNOSIS — R0602 Shortness of breath: Secondary | ICD-10-CM | POA: Insufficient documentation

## 2022-02-03 DIAGNOSIS — N189 Chronic kidney disease, unspecified: Secondary | ICD-10-CM | POA: Diagnosis not present

## 2022-02-03 DIAGNOSIS — Y92410 Unspecified street and highway as the place of occurrence of the external cause: Secondary | ICD-10-CM | POA: Diagnosis not present

## 2022-02-03 DIAGNOSIS — R110 Nausea: Secondary | ICD-10-CM | POA: Diagnosis not present

## 2022-02-03 DIAGNOSIS — F419 Anxiety disorder, unspecified: Secondary | ICD-10-CM | POA: Diagnosis not present

## 2022-02-03 DIAGNOSIS — R519 Headache, unspecified: Secondary | ICD-10-CM | POA: Diagnosis not present

## 2022-02-03 DIAGNOSIS — M542 Cervicalgia: Secondary | ICD-10-CM | POA: Insufficient documentation

## 2022-02-03 DIAGNOSIS — R0789 Other chest pain: Secondary | ICD-10-CM | POA: Insufficient documentation

## 2022-02-03 DIAGNOSIS — D72829 Elevated white blood cell count, unspecified: Secondary | ICD-10-CM | POA: Insufficient documentation

## 2022-02-03 LAB — I-STAT CHEM 8, ED
BUN: 16 mg/dL (ref 6–20)
Calcium, Ion: 1.12 mmol/L — ABNORMAL LOW (ref 1.15–1.40)
Chloride: 102 mmol/L (ref 98–111)
Creatinine, Ser: 1.3 mg/dL — ABNORMAL HIGH (ref 0.44–1.00)
Glucose, Bld: 106 mg/dL — ABNORMAL HIGH (ref 70–99)
HCT: 42 % (ref 36.0–46.0)
Hemoglobin: 14.3 g/dL (ref 12.0–15.0)
Potassium: 3.8 mmol/L (ref 3.5–5.1)
Sodium: 137 mmol/L (ref 135–145)
TCO2: 25 mmol/L (ref 22–32)

## 2022-02-03 LAB — CBC WITH DIFFERENTIAL/PLATELET
Abs Immature Granulocytes: 0.11 10*3/uL — ABNORMAL HIGH (ref 0.00–0.07)
Basophils Absolute: 0 10*3/uL (ref 0.0–0.1)
Basophils Relative: 0 %
Eosinophils Absolute: 0 10*3/uL (ref 0.0–0.5)
Eosinophils Relative: 0 %
HCT: 40.3 % (ref 36.0–46.0)
Hemoglobin: 13.9 g/dL (ref 12.0–15.0)
Immature Granulocytes: 1 %
Lymphocytes Relative: 6 %
Lymphs Abs: 1 10*3/uL (ref 0.7–4.0)
MCH: 32.1 pg (ref 26.0–34.0)
MCHC: 34.5 g/dL (ref 30.0–36.0)
MCV: 93.1 fL (ref 80.0–100.0)
Monocytes Absolute: 1 10*3/uL (ref 0.1–1.0)
Monocytes Relative: 6 %
Neutro Abs: 15.3 10*3/uL — ABNORMAL HIGH (ref 1.7–7.7)
Neutrophils Relative %: 87 %
Platelets: 171 10*3/uL (ref 150–400)
RBC: 4.33 MIL/uL (ref 3.87–5.11)
RDW: 12 % (ref 11.5–15.5)
WBC: 17.4 10*3/uL — ABNORMAL HIGH (ref 4.0–10.5)
nRBC: 0 % (ref 0.0–0.2)

## 2022-02-03 LAB — BASIC METABOLIC PANEL
Anion gap: 10 (ref 5–15)
BUN: 15 mg/dL (ref 6–20)
CO2: 23 mmol/L (ref 22–32)
Calcium: 9 mg/dL (ref 8.9–10.3)
Chloride: 104 mmol/L (ref 98–111)
Creatinine, Ser: 1.19 mg/dL — ABNORMAL HIGH (ref 0.44–1.00)
GFR, Estimated: 56 mL/min — ABNORMAL LOW (ref >60)
Glucose, Bld: 106 mg/dL — ABNORMAL HIGH (ref 70–99)
Potassium: 3.9 mmol/L (ref 3.5–5.1)
Sodium: 137 mmol/L (ref 135–145)

## 2022-02-03 LAB — I-STAT BETA HCG BLOOD, ED (MC, WL, AP ONLY): I-stat hCG, quantitative: <5 m[IU]/mL (ref <5)

## 2022-02-03 LAB — ETHANOL: Alcohol, Ethyl (B): <10 mg/dL (ref <10)

## 2022-02-03 MED ORDER — MORPHINE SULFATE (PF) 4 MG/ML IV SOLN
4.0000 mg | Freq: Once | INTRAVENOUS | Status: AC
  Administered 2022-02-03: 4 mg via INTRAVENOUS
  Filled 2022-02-03: qty 1

## 2022-02-03 MED ORDER — CLONAZEPAM 0.125 MG PO TBDP
0.5000 mg | ORAL_TABLET | Freq: Once | ORAL | Status: AC
  Administered 2022-02-03: 0.5 mg via ORAL
  Filled 2022-02-03: qty 4

## 2022-02-03 MED ORDER — METHOCARBAMOL 500 MG PO TABS: 500.0000 mg | ORAL_TABLET | Freq: Three times a day (TID) | ORAL | 0 refills | Status: AC | PRN

## 2022-02-03 MED ORDER — IOHEXOL 350 MG/ML SOLN
50.0000 mL | Freq: Once | INTRAVENOUS | Status: AC | PRN
  Administered 2022-02-03: 50 mL via INTRAVENOUS

## 2022-02-03 MED ORDER — OXYCODONE HCL 5 MG PO TABS: 2.5000 mg | ORAL_TABLET | ORAL | 0 refills | Status: AC | PRN

## 2022-02-03 MED ORDER — ONDANSETRON HCL 4 MG/2ML IJ SOLN
4.0000 mg | Freq: Once | INTRAMUSCULAR | Status: DC
  Filled 2022-02-03: qty 2

## 2022-02-03 MED ORDER — NAPROXEN 375 MG PO TABS: 375.0000 mg | ORAL_TABLET | Freq: Two times a day (BID) | ORAL | 0 refills | Status: AC

## 2022-02-03 NOTE — Discharge Instructions (Addendum)
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
